# Patient Record
Sex: Female | Born: 1950
Health system: Southern US, Community
[De-identification: ages and names within clinical notes are randomized; demographics above are authoritative.]

## PROBLEM LIST (undated history)

## (undated) DIAGNOSIS — M1990 Unspecified osteoarthritis, unspecified site: Secondary | ICD-10-CM

## (undated) DIAGNOSIS — T8859XA Other complications of anesthesia, initial encounter: Secondary | ICD-10-CM

## (undated) DIAGNOSIS — R112 Nausea with vomiting, unspecified: Secondary | ICD-10-CM

## (undated) DIAGNOSIS — T4145XA Adverse effect of unspecified anesthetic, initial encounter: Secondary | ICD-10-CM

## (undated) DIAGNOSIS — Z9889 Other specified postprocedural states: Secondary | ICD-10-CM

## (undated) DIAGNOSIS — K5900 Constipation, unspecified: Secondary | ICD-10-CM

---
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
## 1976-05-04 HISTORY — PX: SINUS IRRIGATION: SHX2411

## 1987-05-05 DIAGNOSIS — M797 Fibromyalgia: Secondary | ICD-10-CM

## 1987-05-05 HISTORY — DX: Fibromyalgia: M79.7

## 2003-05-06 ENCOUNTER — Emergency Department (HOSPITAL_COMMUNITY): Admission: EM | Admit: 2003-05-06 | Discharge: 2003-05-06 | Payer: Self-pay | Admitting: Emergency Medicine

## 2003-05-08 ENCOUNTER — Encounter: Admission: RE | Admit: 2003-05-08 | Discharge: 2003-05-08 | Payer: Self-pay

## 2004-11-12 ENCOUNTER — Encounter: Admission: RE | Admit: 2004-11-12 | Discharge: 2004-11-12 | Payer: Self-pay | Admitting: Rheumatology

## 2009-04-10 ENCOUNTER — Ambulatory Visit: Payer: Self-pay | Admitting: Women's Health

## 2009-04-10 ENCOUNTER — Other Ambulatory Visit: Admission: RE | Admit: 2009-04-10 | Discharge: 2009-04-10 | Payer: Self-pay | Admitting: Gynecology

## 2009-04-23 ENCOUNTER — Ambulatory Visit: Payer: Self-pay | Admitting: Women's Health

## 2009-05-23 ENCOUNTER — Ambulatory Visit: Payer: Self-pay | Admitting: Women's Health

## 2009-06-12 ENCOUNTER — Ambulatory Visit: Payer: Self-pay | Admitting: Women's Health

## 2009-06-18 ENCOUNTER — Ambulatory Visit: Payer: Self-pay | Admitting: Gynecology

## 2009-07-10 ENCOUNTER — Ambulatory Visit: Payer: Self-pay | Admitting: Gynecology

## 2009-10-23 ENCOUNTER — Ambulatory Visit: Payer: Self-pay | Admitting: Women's Health

## 2010-09-03 ENCOUNTER — Other Ambulatory Visit (HOSPITAL_COMMUNITY)
Admission: RE | Admit: 2010-09-03 | Discharge: 2010-09-03 | Disposition: A | Discharge status: Home / Self Care | Payer: BC Managed Care – PPO | Source: Ambulatory Visit | Attending: Gynecology | Admitting: Gynecology

## 2010-09-03 ENCOUNTER — Encounter (INDEPENDENT_AMBULATORY_CARE_PROVIDER_SITE_OTHER): Payer: BC Managed Care – PPO | Admitting: Women's Health

## 2010-09-03 ENCOUNTER — Other Ambulatory Visit: Payer: Self-pay | Admitting: Women's Health

## 2010-09-03 DIAGNOSIS — Z833 Family history of diabetes mellitus: Secondary | ICD-10-CM

## 2010-09-03 DIAGNOSIS — Z124 Encounter for screening for malignant neoplasm of cervix: Secondary | ICD-10-CM | POA: Insufficient documentation

## 2010-09-03 DIAGNOSIS — R5381 Other malaise: Secondary | ICD-10-CM

## 2010-09-03 DIAGNOSIS — Z01419 Encounter for gynecological examination (general) (routine) without abnormal findings: Secondary | ICD-10-CM

## 2010-09-03 DIAGNOSIS — R823 Hemoglobinuria: Secondary | ICD-10-CM

## 2011-02-25 ENCOUNTER — Encounter (HOSPITAL_COMMUNITY)
Admission: RE | Admit: 2011-02-25 | Discharge: 2011-02-25 | Disposition: A | Discharge status: Home / Self Care | Payer: BC Managed Care – PPO | Source: Ambulatory Visit | Attending: Neurological Surgery | Admitting: Neurological Surgery

## 2011-02-25 LAB — CBC
MCH: 32.5 pg (ref 26.0–34.0)
MCHC: 35 g/dL (ref 30.0–36.0)
MCV: 92.7 fL (ref 78.0–100.0)
Platelets: 174 10*3/uL (ref 150–400)
RBC: 4.22 MIL/uL (ref 3.87–5.11)
RDW: 12.3 % (ref 11.5–15.5)

## 2011-02-25 LAB — ABO/RH: ABO/RH(D): A POS

## 2011-03-03 ENCOUNTER — Inpatient Hospital Stay (HOSPITAL_COMMUNITY): Payer: BC Managed Care – PPO

## 2011-03-03 ENCOUNTER — Inpatient Hospital Stay (HOSPITAL_COMMUNITY)
Admission: RE | Admit: 2011-03-03 | Discharge: 2011-03-07 | DRG: 755 | Disposition: A | Discharge status: Home Health | Payer: BC Managed Care – PPO | Source: Ambulatory Visit | Attending: Neurological Surgery | Admitting: Neurological Surgery

## 2011-03-03 DIAGNOSIS — K589 Irritable bowel syndrome without diarrhea: Secondary | ICD-10-CM | POA: Diagnosis present

## 2011-03-03 DIAGNOSIS — Q762 Congenital spondylolisthesis: Principal | ICD-10-CM

## 2011-03-03 DIAGNOSIS — K5900 Constipation, unspecified: Secondary | ICD-10-CM | POA: Diagnosis not present

## 2011-03-03 DIAGNOSIS — Z01812 Encounter for preprocedural laboratory examination: Secondary | ICD-10-CM

## 2011-03-03 DIAGNOSIS — D62 Acute posthemorrhagic anemia: Secondary | ICD-10-CM | POA: Diagnosis not present

## 2011-03-03 DIAGNOSIS — M48062 Spinal stenosis, lumbar region with neurogenic claudication: Secondary | ICD-10-CM | POA: Diagnosis present

## 2011-03-03 DIAGNOSIS — Z79899 Other long term (current) drug therapy: Secondary | ICD-10-CM

## 2011-03-03 DIAGNOSIS — Z23 Encounter for immunization: Secondary | ICD-10-CM

## 2011-03-04 LAB — BASIC METABOLIC PANEL
Chloride: 104 mEq/L (ref 96–112)
GFR calc non Af Amer: >90 mL/min (ref >90)
Glucose, Bld: 113 mg/dL — ABNORMAL HIGH (ref 70–99)
Potassium: 3.6 mEq/L (ref 3.5–5.1)
Sodium: 138 mEq/L (ref 135–145)

## 2011-03-04 LAB — CBC
HCT: 21 % — ABNORMAL LOW (ref 36.0–46.0)
Hemoglobin: 7.1 g/dL — ABNORMAL LOW (ref 12.0–15.0)
MCHC: 33.8 g/dL (ref 30.0–36.0)
RBC: 2.21 MIL/uL — ABNORMAL LOW (ref 3.87–5.11)
WBC: 9.7 10*3/uL (ref 4.0–10.5)

## 2011-03-04 LAB — GLUCOSE, CAPILLARY: Glucose-Capillary: 92 mg/dL (ref 70–99)

## 2011-03-05 LAB — TYPE AND SCREEN
ABO/RH(D): A POS
Unit division: 0

## 2011-03-05 LAB — CBC
Hemoglobin: 10.2 g/dL — ABNORMAL LOW (ref 12.0–15.0)
MCV: 90.2 fL (ref 78.0–100.0)
Platelets: 114 10*3/uL — ABNORMAL LOW (ref 150–400)
RBC: 3.27 MIL/uL — ABNORMAL LOW (ref 3.87–5.11)
WBC: 9.3 10*3/uL (ref 4.0–10.5)

## 2011-03-06 MED ORDER — SENNA 8.6 MG PO TABS
1.0000 | ORAL_TABLET | Freq: Every evening | ORAL | Status: DC | PRN
  Filled 2011-03-06: qty 1

## 2011-03-06 MED ORDER — SODIUM CHLORIDE 0.9 % IJ SOLN: 3.0000 mL | Freq: Two times a day (BID) | INTRAMUSCULAR | Status: DC

## 2011-03-06 MED ORDER — AZITHROMYCIN 250 MG PO TABS: 250.0000 mg | ORAL_TABLET | ORAL | Status: DC

## 2011-03-06 MED ORDER — DIAZEPAM 5 MG PO TABS: 5.0000 mg | ORAL_TABLET | Freq: Four times a day (QID) | ORAL | Status: DC | PRN

## 2011-03-06 MED ORDER — OXYCODONE-ACETAMINOPHEN 5-325 MG PO TABS: 1.0000 | ORAL_TABLET | ORAL | Status: DC | PRN

## 2011-03-06 MED ORDER — ONDANSETRON HCL 4 MG/2ML IJ SOLN: 4.0000 mg | INTRAMUSCULAR | Status: DC | PRN

## 2011-03-06 MED ORDER — ALUM & MAG HYDROXIDE-SIMETH 200-200-20 MG/5ML PO SUSP: 30.0000 mL | Freq: Four times a day (QID) | ORAL | Status: DC | PRN

## 2011-03-06 MED ORDER — CYCLOSPORINE 0.05 % OP EMUL
1.0000 [drp] | Freq: Two times a day (BID) | OPHTHALMIC | Status: DC
  Filled 2011-03-06 (×4): qty 1

## 2011-03-06 MED ORDER — DOCUSATE SODIUM 100 MG PO CAPS: 100.0000 mg | ORAL_CAPSULE | Freq: Two times a day (BID) | ORAL | Status: DC

## 2011-03-06 MED ORDER — PHENOL 1.4 % MT LIQD: 1.0000 | OROMUCOSAL | Status: DC | PRN

## 2011-03-06 MED ORDER — MENTHOL 3 MG MT LOZG: 1.0000 | LOZENGE | OROMUCOSAL | Status: DC | PRN

## 2011-03-06 NOTE — Op Note (Signed)
NAME:  Melanie Golden, Melanie Golden                  ACCOUNT NO.:  [id number]  MEDICAL RECORD NO.:  [id number]  LOCATION:  3039                         FACILITY:  [facility]  PHYSICIAN:  Stefani Dama, M.D.  DATE OF BIRTH:  1951-03-24  DATE OF PROCEDURE:  03/03/2011 DATE OF DISCHARGE:                              OPERATIVE REPORT   PREOPERATIVE DIAGNOSES:  Spondylolisthesis, L4-L5 and L5-S1 with severe central lumbar stenosis, L4-5; right lumbar stenosis, L5-S1; neurogenic claudication; lumbar radiculopathy.  POSTOPERATIVE DIAGNOSES:  Spondylolisthesis, L4-L5 and L5-S1 with severe central lumbar stenosis, L4-5; right lumbar stenosis, L5-S1; neurogenic claudication; lumbar radiculopathy.  OPERATIONS:  Bilateral laminotomies and decompression of L4, L5, and S1 nerve roots bilaterally, with laminotomies at L4-5; total laminectomy at L5; posterior lumbar interbody arthrodesis with PEEK spacers, local autograft, and allograft with L4-5 and L5-S1 using 13 mm spacers at L4-5 and 11 mm spacers at L5-S1; segmental fixation L4 to sacrum; posterolateral arthrodesis with allograft.  SURGEON:  Stefani Dama, MD  FIRST ASSISTANT:  Clydene Fake, MD  ANESTHESIA:  General endotracheal.  INDICATIONS:  Melanie Golden is a 60 year old individual who has had significant bilateral lower extremity pain.  Has had numbness, tingling, and weakness in the lower extremities.  She has been unable to walk and unable to even do activities of daily living.  Because of her misery, she was advised regarding need for surgery to decompress her severe stenosis at L4-5 and stabilize from L4 to the sacrum.  PROCEDURE:  The patient was brought to the operating room, supine on the stretcher.  After smooth induction of general endotracheal anesthesia, she was placed in a prone position.  The back was prepped with alcohol and DuraPrep, and draped in a sterile fashion.  Midline incision was created and carried down to the lumbodorsal  fascia, which was opened on either side of midline, and then we identified the spinous processes of L5 and L4 positively on radiographs.  By performing large laminotomies of the inferior margin of L4, the common dural tube was exposed. Significant overgrowth in the facet joints was encountered and this was causing and contributing to a substantial central canal stenosis.  Once this area was decompressed, the path of the L4 nerve root superiorly and the L5 nerve root inferiorly could be traced.  There was a significant spondylitic ridge from the spondylolisthesis at the L5 junction.  The disk was noted to be soft in this area and was incised, first on the left side then on the right side, and a gross total diskectomy was performed at L4-L5. After assuring that the paths of the L4 and L5 nerve roots were well protected.  A diskectomy was performed from both sides. The endplates were curetted and rongeured smooth and then trial spacers were placed, first at  11 mm, then 12 and then ultimately at 13 mm.  The 13 mm peek spacers were then packed with Vitoss, which was soaked with PureGen and autograft was placed into the interspace after an adequate decortication was performed; 13 mm spacers were then placed, first on the left side and then on the right side.  Some additional strips of Vitoss bone sponge with  the PureGen on it was placed into the interspace also.  Attention was then turned to L5-S1 where similar procedure was carried out after total laminectomy of L5, which allowed for good visualization of the right L5 nerve root, particularly which was severely stenotic.  The path of the L5 nerve root was cleared into the extraforaminal space.  Diskectomy was performed in a similar fashion, as it was at L4-L5, and a gross total decompression and total diskectomy was performed here.  Ultimately, starting an 8 mm spacer, we were able to dilate to 11 mm size and 11 mm PEEK spacers were filled  with similar material and the graft space was filled with autograft and some remaining strips of Vitoss bone sponge soaked with PureGen.  In the end, the paths for the S1, the L5, and the L4 nerve roots were checked and found to be free and clear.  Pedicle entry sites were then chosen, using fluoroscopic guidance to place 5.5 x 30 mm screws using a medial to lateral trajectory in L4, a 5.5 x 40 mm screws in L5 and 6.5 x 30 mm screws in the sacrum, again using a medial to lateral trajectory.  The 60 mm precontoured rods were then contoured further to fit in the saddles of the screws and these were torqued down to the final resting position.  In the end, good decompression of the individual nerve roots and the common dural tube was achieved.  Lateral gutters were packed with additional Vitoss bone sponge without PureGen after the lateral gutters had been decorticated.  With this then, hemostasis in the soft tissues was obtained, and the lumbodorsal fascia was closed with #1 Vicryl interrupted fashion, 2-0 Vicryl in the subcutaneous tissues.  At this point, we also injected 10 mL of 0.5% Marcaine into the intramuscular and subfascial space, and then the final skin closure was performed with 3-0 Vicryl in inverted interrupted fashion.  Dermabond was placed on the skin.  Blood loss was estimated at 750 mL and 250 mL of Cell Saver blood was returned.     Stefani Dama, M.D.     Merla Riches  D:  03/03/2011  T:  03/03/2011  Job:  [id number]  Electronically Signed by Barnett Abu M.D. on 03/06/2011 05:50:15 AM

## 2011-03-08 NOTE — Discharge Summary (Signed)
  [name], [name]                  ACCOUNT NO.:  [id number]  MEDICAL RECORD NO.:  [id number]  LOCATION:  3039                         FACILITY:  [facility]  PHYSICIAN:  Stefani Dama, M.D.  DATE OF BIRTH:  Dec 23, 1950  DATE OF ADMISSION:  03/03/2011 DATE OF DISCHARGE:  03/07/2011                              DISCHARGE SUMMARY   ADMITTING DIAGNOSES: 1. Spondylolisthesis, L4-5 and L5-S1 with severe central lumbar     stenosis, L4-5. 2. Right lumbar stenosis, L5-S1. 3. Neurogenic claudication, lumbar radiculopathy.  DISCHARGED AND FINAL DIAGNOSES: 1. Spondylolisthesis, L4-L5 and L5-S1. 2. Severe central lumbar stenosis, L4-5. 3. Right lumbar stenosis, L5-S1. 4. Neurogenic claudication, lumbar radiculopathy. 5. Acute blood loss anemia status post transfusion 2 units packed     cells. 6. Constipation postoperatively.  CONDITION ON DISCHARGE:  Improving.  HOSPITAL COURSE:  Ms. Melanie Golden is a 60 year old individual who has had significant problems with back pain, lower extremity pain and weakness. She has evidence of significant spondylosis in the lower lumbar spine with spondylolisthesis and stenosis and was taken to the operating room on March 03, 2011, to undergo decompression of L4-5 and L5-S1. Posterior lumbar interbody arthrodesis was created with a segmental fixation L4 to the sacrum.  Postoperatively, the patient was noted to feel rather weak.  She had a hemoglobin down to 7 and had acute blood loss anemia.  This was treated with 2 units packed cells transfusion and her situation improved.  She has gradually mobilized.  Her incision has remained clean and dry, and at that the time of discharge, she is ambulating with the use of a walker.  She did have postoperative constipation, was relieved with laxatives.  She will be seen in the office in 3 weeks' time for follow up.  She is given a prescription for Percocet #40 without refill,  Valium 5 mg, #40 without refill as  needed for pain and spasm.     Stefani Dama, M.D.     Merla Riches  D:  03/06/2011  T:  03/07/2011  Job:  [id number]  Electronically Signed by Barnett Abu M.D. on 03/08/2011 10:21:29 PM

## 2011-05-05 HISTORY — PX: BACK SURGERY: SHX140

## 2013-08-29 ENCOUNTER — Other Ambulatory Visit: Payer: Self-pay | Admitting: Dermatology

## 2017-07-20 ENCOUNTER — Other Ambulatory Visit: Payer: Self-pay | Admitting: Family Medicine

## 2017-07-20 DIAGNOSIS — Z1231 Encounter for screening mammogram for malignant neoplasm of breast: Secondary | ICD-10-CM

## 2017-08-06 ENCOUNTER — Ambulatory Visit
Admission: RE | Admit: 2017-08-06 | Discharge: 2017-08-06 | Disposition: A | Discharge status: Home / Self Care | Payer: BLUE CROSS/BLUE SHIELD | Source: Ambulatory Visit | Attending: Family Medicine | Admitting: Family Medicine

## 2017-08-06 DIAGNOSIS — Z1231 Encounter for screening mammogram for malignant neoplasm of breast: Secondary | ICD-10-CM

## 2019-06-30 DIAGNOSIS — M47812 Spondylosis without myelopathy or radiculopathy, cervical region: Secondary | ICD-10-CM | POA: Diagnosis not present

## 2019-06-30 DIAGNOSIS — M19012 Primary osteoarthritis, left shoulder: Secondary | ICD-10-CM | POA: Diagnosis not present

## 2019-07-06 DIAGNOSIS — M25512 Pain in left shoulder: Secondary | ICD-10-CM | POA: Diagnosis not present

## 2019-07-11 DIAGNOSIS — M19012 Primary osteoarthritis, left shoulder: Secondary | ICD-10-CM | POA: Diagnosis not present

## 2019-07-19 DIAGNOSIS — M19012 Primary osteoarthritis, left shoulder: Secondary | ICD-10-CM | POA: Diagnosis not present

## 2019-07-27 DIAGNOSIS — Z79899 Other long term (current) drug therapy: Secondary | ICD-10-CM | POA: Diagnosis not present

## 2019-07-27 DIAGNOSIS — Z5181 Encounter for therapeutic drug level monitoring: Secondary | ICD-10-CM | POA: Diagnosis not present

## 2019-07-27 DIAGNOSIS — Z0000 Encounter for general adult medical examination without abnormal findings: Secondary | ICD-10-CM | POA: Diagnosis not present

## 2019-07-27 DIAGNOSIS — Z13220 Encounter for screening for lipoid disorders: Secondary | ICD-10-CM | POA: Diagnosis not present

## 2019-08-07 DIAGNOSIS — S40012A Contusion of left shoulder, initial encounter: Secondary | ICD-10-CM | POA: Diagnosis not present

## 2019-08-11 NOTE — Patient Instructions (Signed)
DUE TO COVID-19 ONLY ONE VISITOR IS ALLOWED TO COME WITH YOU AND STAY IN THE WAITING ROOM ONLY DURING PRE OP AND PROCEDURE DAY OF SURGERY. THE 2 VISITORS MAY VISIT WITH YOU AFTER SURGERY IN YOUR PRIVATE ROOM DURING VISITING HOURS ONLY!  YOU NEED TO HAVE A COVID 19 TEST ON_4/16______ @_1 :00______, THIS TEST MUST BE DONE BEFORE SURGERY, COME  [place] , [id number].  ([place]) ONCE YOUR COVID TEST IS COMPLETED, PLEASE BEGIN THE QUARANTINE INSTRUCTIONS AS OUTLINED IN YOUR HANDOUT.                Melanie Golden    Your procedure is scheduled on: 08/22/19   Report to [facility] Main  Entrance   Report to Short Stay at 5:30 AM     Call this number if you have problems the morning of surgery [phone]   . BRUSH YOUR TEETH MORNING OF SURGERY AND RINSE YOUR MOUTH OUT, NO CHEWING GUM CANDY OR MINTS.   Do not eat food After Midnight.   YOU MAY HAVE CLEAR LIQUIDS FROM MIDNIGHT UNTIL 4:30 AM.   At 4:30 AM Please finish the prescribed Pre-Surgery  drink.   Nothing by mouth after you finish the  drink !    Take these medicines the morning of surgery with A SIP OF WATER:  You may use your eye drops                                You may not have any metal on your body including hair pins and              piercings  Do not wear jewelry, make-up, lotions, powders or perfumes, deodorant             Do not wear nail polish on your fingernails.  Do not shave  48 hours prior to surgery.               Do not bring valuables to the hospital. [place].  Contacts, dentures or bridgework may not be worn into surgery.                    Please read over the following fact sheets you were given: _____________________________________________________________________  [facility]- Preparing for Total Shoulder Arthroplasty    Before surgery, you can play an important role. Because skin is not sterile, your  skin needs to be as free of germs as possible. You can reduce the number of germs on your skin by using the following products. . Benzoyl Peroxide Gel o Reduces the number of germs present on the skin o Applied twice a day to shoulder area starting two days before surgery    ==================================================================  Please follow these instructions carefully:  BENZOYL PEROXIDE 5% GEL  Please do not use if you have an allergy to benzoyl peroxide.   If your skin becomes reddened/irritated stop using the benzoyl peroxide.  Starting two days before surgery, apply as follows: 1. Apply benzoyl peroxide in the morning and at night. Apply after taking a shower. If you are not taking a shower clean entire shoulder front, back, and side along with the armpit with a clean wet washcloth.  2. Place a quarter-sized dollop on your shoulder and rub in thoroughly,  making sure to cover the front, back, and side of your shoulder, along with the armpit.   2 days before ____ AM   ____ PM              1 day before ____ AM   ____ PM                         3. Do this twice a day for two days.  (Last application is the night before surgery, AFTER using the CHG soap as described below).  4. Do NOT apply benzoyl peroxide gel on the day of surgery.            [facility] - Preparing for Surgery Before surgery, you can play an important role.   Because skin is not sterile, your skin needs to be as free of germs as possible.   You can reduce the number of germs on your skin by washing with CHG (chlorahexidine gluconate) soap before surgery .  CHG is an antiseptic cleaner which kills germs and bonds with the skin to continue killing germs even after washing. Please DO NOT use if you have an allergy to CHG or antibacterial soaps.   If your skin becomes reddened/irritated stop using the CHG and inform your nurse when you arrive at Short Stay. Do not shave (including legs and underarms) for  at least 48 hours prior to the first CHG shower.   Please follow these instructions carefully:  1.  Shower with CHG Soap the night before surgery and the  morning of Surgery.  2.  If you choose to wash your hair, wash your hair first as usual with your  normal  shampoo.  3.  After you shampoo, rinse your hair and body thoroughly to remove the  shampoo.                                        4.  Use CHG as you would any other liquid soap.  You can apply chg directly  to the skin and wash                       Gently with a scrungie or clean washcloth.  5.  Apply the CHG Soap to your body ONLY FROM THE NECK DOWN.   Do not use on face/ open                           Wound or open sores. Avoid contact with eyes, ears mouth and genitals (private parts).                       Wash face,  Genitals (private parts) with your normal soap.             6.  Wash thoroughly, paying special attention to the area where your surgery  will be performed.  7.  Thoroughly rinse your body with warm water from the neck down.  8.  DO NOT shower/wash with your normal soap after using and rinsing off  the CHG Soap.             9.  Pat yourself dry with a clean towel.            10.  Wear clean pajamas.  11.  Place clean sheets on your bed the night of your first shower and do not  sleep with pets. Day of Surgery : Do not apply any lotions/deodorants the morning of surgery.  Please wear clean clothes to the hospital/surgery center.  FAILURE TO FOLLOW THESE INSTRUCTIONS MAY RESULT IN THE CANCELLATION OF YOUR SURGERY PATIENT SIGNATURE_________________________________  NURSE SIGNATURE__________________________________  ________________________________________________________________________   [name]  An incentive spirometer is a tool that can help keep your lungs clear and active. This tool measures how well you are filling your lungs with each breath. Taking long deep breaths may help reverse  or decrease the chance of developing breathing (pulmonary) problems (especially infection) following:  A long period of time when you are unable to move or be active. BEFORE THE PROCEDURE   If the spirometer includes an indicator to show your best effort, your nurse or respiratory therapist will set it to a desired goal.  If possible, sit up straight or lean slightly forward. Try not to slouch.  Hold the incentive spirometer in an upright position. INSTRUCTIONS FOR USE  1. Sit on the edge of your bed if possible, or sit up as far as you can in bed or on a chair. 2. Hold the incentive spirometer in an upright position. 3. Breathe out normally. 4. Place the mouthpiece in your mouth and seal your lips tightly around it. 5. Breathe in slowly and as deeply as possible, raising the piston or the ball toward the top of the column. 6. Hold your breath for 3-5 seconds or for as long as possible. Allow the piston or ball to fall to the bottom of the column. 7. Remove the mouthpiece from your mouth and breathe out normally. 8. Rest for a few seconds and repeat Steps 1 through 7 at least 10 times every 1-2 hours when you are awake. Take your time and take a few normal breaths between deep breaths. 9. The spirometer may include an indicator to show your best effort. Use the indicator as a goal to work toward during each repetition. 10. After each set of 10 deep breaths, practice coughing to be sure your lungs are clear. If you have an incision (the cut made at the time of surgery), support your incision when coughing by placing a pillow or rolled up towels firmly against it. Once you are able to get out of bed, walk around indoors and cough well. You may stop using the incentive spirometer when instructed by your caregiver.  RISKS AND COMPLICATIONS  Take your time so you do not get dizzy or light-headed.  If you are in pain, you may need to take or ask for pain medication before doing incentive  spirometry. It is harder to take a deep breath if you are having pain. AFTER USE  Rest and breathe slowly and easily.  It can be helpful to keep track of a log of your progress. Your caregiver can provide you with a simple table to help with this. If you are using the spirometer at home, follow these instructions: SEEK MEDICAL CARE IF:   You are having difficultly using the spirometer.  You have trouble using the spirometer as often as instructed.  Your pain medication is not giving enough relief while using the spirometer.  You develop fever of 100.5 F (38.1 C) or higher. SEEK IMMEDIATE MEDICAL CARE IF:   You cough up bloody sputum that had not been present before.  You develop fever of 102 F (38.9 C)  or greater.  You develop worsening pain at or near the incision site. MAKE SURE YOU:   Understand these instructions.  Will watch your condition.  Will get help right away if you are not doing well or get worse. Document Released: 08/31/2006 Document Revised: 07/13/2011 Document Reviewed: 11/01/2006 [facility] Patient Information 2014 [place], [place].   ________________________________________________________________________

## 2019-08-14 ENCOUNTER — Encounter (HOSPITAL_COMMUNITY)
Admission: RE | Admit: 2019-08-14 | Discharge: 2019-08-14 | Disposition: A | Discharge status: Home / Self Care | Payer: Medicare HMO | Source: Ambulatory Visit | Attending: Orthopedic Surgery | Admitting: Orthopedic Surgery

## 2019-08-14 ENCOUNTER — Encounter (HOSPITAL_COMMUNITY): Payer: Self-pay | Admitting: *Deleted

## 2019-08-14 ENCOUNTER — Other Ambulatory Visit: Payer: Self-pay

## 2019-08-14 DIAGNOSIS — Z01812 Encounter for preprocedural laboratory examination: Secondary | ICD-10-CM | POA: Diagnosis not present

## 2019-08-14 HISTORY — DX: Other specified postprocedural states: Z98.890

## 2019-08-14 HISTORY — DX: Nausea with vomiting, unspecified: R11.2

## 2019-08-14 HISTORY — DX: Other complications of anesthesia, initial encounter: T88.59XA

## 2019-08-14 HISTORY — DX: Unspecified osteoarthritis, unspecified site: M19.90

## 2019-08-14 LAB — CBC
HCT: 40.3 % (ref 36.0–46.0)
Hemoglobin: 13.2 g/dL (ref 12.0–15.0)
MCH: 31.6 pg (ref 26.0–34.0)
MCHC: 32.8 g/dL (ref 30.0–36.0)
MCV: 96.4 fL (ref 80.0–100.0)
Platelets: 190 10*3/uL (ref 150–400)
RBC: 4.18 MIL/uL (ref 3.87–5.11)
RDW: 12.5 % (ref 11.5–15.5)
WBC: 4.6 10*3/uL (ref 4.0–10.5)
nRBC: 0 % (ref 0.0–0.2)

## 2019-08-14 LAB — SURGICAL PCR SCREEN
MRSA, PCR: NEGATIVE
Staphylococcus aureus: POSITIVE — AB

## 2019-08-14 NOTE — Progress Notes (Signed)
PCR results 08/14/19 faxed to Dr. Dion Saucier via epic.

## 2019-08-14 NOTE — Progress Notes (Signed)
PCP - Dr. Andee Poles Cardiologist - no  Chest x-ray - no EKG -no  Stress Test - no ECHO - no Cardiac Cath - no  Sleep Study - no CPAP -   Fasting Blood Sugar - NA Checks Blood Sugar _____ times a day  Blood Thinner Instructions:NA Aspirin Instructions: Last Dose:  Anesthesia review:   Patient denies shortness of breath, fever, cough and chest pain at PAT appointment yes  Patient verbalized understanding of instructions that were given to them at the PAT appointment. Patient was also instructed that they will need to review over the PAT instructions again at home before surgery. yes

## 2019-08-18 ENCOUNTER — Other Ambulatory Visit (HOSPITAL_COMMUNITY)
Admission: RE | Admit: 2019-08-18 | Discharge: 2019-08-18 | Disposition: A | Discharge status: Home / Self Care | Payer: Medicare HMO | Source: Ambulatory Visit | Attending: Orthopedic Surgery | Admitting: Orthopedic Surgery

## 2019-08-18 DIAGNOSIS — Z20822 Contact with and (suspected) exposure to covid-19: Secondary | ICD-10-CM | POA: Diagnosis not present

## 2019-08-18 DIAGNOSIS — Z01812 Encounter for preprocedural laboratory examination: Secondary | ICD-10-CM | POA: Diagnosis not present

## 2019-08-18 LAB — SARS CORONAVIRUS 2 (TAT 6-24 HRS): SARS Coronavirus 2: NEGATIVE

## 2019-08-21 ENCOUNTER — Encounter (HOSPITAL_COMMUNITY): Payer: Self-pay | Admitting: Orthopedic Surgery

## 2019-08-21 NOTE — Anesthesia Preprocedure Evaluation (Addendum)
Anesthesia Evaluation  Patient identified by MRN, date of birth, ID band Patient awake    Reviewed: Allergy & Precautions, NPO status , Patient's Chart, lab work & pertinent test results  History of Anesthesia Complications (+) PONV and history of anesthetic complications  Airway Mallampati: II  TM Distance: >3 FB Neck ROM: Full    Dental no notable dental hx. (+) Teeth Intact   Pulmonary neg pulmonary ROS,    Pulmonary exam normal breath sounds clear to auscultation       Cardiovascular negative cardio ROS Normal cardiovascular exam Rhythm:Regular Rate:Normal     Neuro/Psych  Neuromuscular disease negative psych ROS   GI/Hepatic negative GI ROS, Neg liver ROS,   Endo/Other  negative endocrine ROS  Renal/GU negative Renal ROS  negative genitourinary   Musculoskeletal  (+) Arthritis , Osteoarthritis,  Fibromyalgia -DJD left shoulder   Abdominal   Peds  Hematology negative hematology ROS (+)   Anesthesia Other Findings   Reproductive/Obstetrics                            Anesthesia Physical Anesthesia Plan  ASA: II  Anesthesia Plan: General   Post-op Pain Management:  Regional for Post-op pain   Induction: Intravenous  PONV Risk Score and Plan: 4 or greater and Scopolamine patch - Pre-op, Ondansetron, Dexamethasone, Treatment may vary due to age or medical condition and Diphenhydramine  Airway Management Planned: Oral ETT  Additional Equipment:   Intra-op Plan:   Post-operative Plan: Extubation in OR  Informed Consent: I have reviewed the patients History and Physical, chart, labs and discussed the procedure including the risks, benefits and alternatives for the proposed anesthesia with the patient or authorized representative who has indicated his/her understanding and acceptance.     Dental advisory given  Plan Discussed with: CRNA and Surgeon  Anesthesia Plan Comments:         Anesthesia Quick Evaluation

## 2019-08-22 ENCOUNTER — Other Ambulatory Visit: Payer: Self-pay

## 2019-08-22 ENCOUNTER — Encounter (HOSPITAL_COMMUNITY)
Admission: RE | Disposition: A | Discharge status: Home / Self Care | Payer: Self-pay | Source: Home / Self Care | Attending: Orthopedic Surgery

## 2019-08-22 ENCOUNTER — Ambulatory Visit (HOSPITAL_COMMUNITY): Payer: Medicare HMO | Admitting: Anesthesiology

## 2019-08-22 ENCOUNTER — Observation Stay (HOSPITAL_COMMUNITY): Payer: Medicare HMO

## 2019-08-22 ENCOUNTER — Encounter (HOSPITAL_COMMUNITY): Payer: Self-pay | Admitting: Orthopedic Surgery

## 2019-08-22 ENCOUNTER — Observation Stay (HOSPITAL_COMMUNITY)
Admission: RE | Admit: 2019-08-22 | Discharge: 2019-08-23 | Disposition: A | Discharge status: Home / Self Care | Payer: Medicare HMO | Attending: Orthopedic Surgery | Admitting: Orthopedic Surgery

## 2019-08-22 DIAGNOSIS — Z79899 Other long term (current) drug therapy: Secondary | ICD-10-CM | POA: Insufficient documentation

## 2019-08-22 DIAGNOSIS — M19012 Primary osteoarthritis, left shoulder: Principal | ICD-10-CM | POA: Diagnosis present

## 2019-08-22 DIAGNOSIS — Z471 Aftercare following joint replacement surgery: Secondary | ICD-10-CM | POA: Diagnosis not present

## 2019-08-22 DIAGNOSIS — G8918 Other acute postprocedural pain: Secondary | ICD-10-CM | POA: Diagnosis not present

## 2019-08-22 DIAGNOSIS — Z96612 Presence of left artificial shoulder joint: Secondary | ICD-10-CM | POA: Diagnosis not present

## 2019-08-22 HISTORY — PX: TOTAL SHOULDER ARTHROPLASTY: SHX126

## 2019-08-22 SURGERY — ARTHROPLASTY, SHOULDER, TOTAL
Anesthesia: General | Site: Shoulder | Laterality: Left

## 2019-08-22 MED ORDER — METOCLOPRAMIDE HCL 5 MG/ML IJ SOLN: 10.0000 mg | Freq: Once | INTRAMUSCULAR | Status: DC | PRN

## 2019-08-22 MED ORDER — FENTANYL CITRATE (PF) 100 MCG/2ML IJ SOLN
INTRAMUSCULAR | Status: AC
  Filled 2019-08-22: qty 2

## 2019-08-22 MED ORDER — POLYVINYL ALCOHOL 1.4 % OP SOLN
1.0000 [drp] | Freq: Three times a day (TID) | OPHTHALMIC | Status: DC | PRN
  Filled 2019-08-22: qty 15

## 2019-08-22 MED ORDER — FENTANYL CITRATE (PF) 100 MCG/2ML IJ SOLN
INTRAMUSCULAR | Status: DC | PRN
  Administered 2019-08-22: 50 ug via INTRAVENOUS
  Administered 2019-08-22 (×2): 25 ug via INTRAVENOUS

## 2019-08-22 MED ORDER — ROCURONIUM BROMIDE 10 MG/ML (PF) SYRINGE
PREFILLED_SYRINGE | INTRAVENOUS | Status: AC
  Filled 2019-08-22: qty 10

## 2019-08-22 MED ORDER — ZOLPIDEM TARTRATE 5 MG PO TABS: 5.0000 mg | ORAL_TABLET | Freq: Every evening | ORAL | Status: DC | PRN

## 2019-08-22 MED ORDER — SENNA-DOCUSATE SODIUM 8.6-50 MG PO TABS: 2.0000 | ORAL_TABLET | Freq: Every day | ORAL | 1 refills | Status: DC

## 2019-08-22 MED ORDER — ROCURONIUM BROMIDE 10 MG/ML (PF) SYRINGE
PREFILLED_SYRINGE | INTRAVENOUS | Status: DC | PRN
  Administered 2019-08-22: 40 mg via INTRAVENOUS

## 2019-08-22 MED ORDER — SUGAMMADEX SODIUM 200 MG/2ML IV SOLN
INTRAVENOUS | Status: DC | PRN
  Administered 2019-08-22: 120 mg via INTRAVENOUS

## 2019-08-22 MED ORDER — LIDOCAINE HCL (CARDIAC) PF 100 MG/5ML IV SOSY
PREFILLED_SYRINGE | INTRAVENOUS | Status: DC | PRN
  Administered 2019-08-22: 60 mg via INTRAVENOUS

## 2019-08-22 MED ORDER — METOCLOPRAMIDE HCL 5 MG PO TABS: 5.0000 mg | ORAL_TABLET | Freq: Three times a day (TID) | ORAL | Status: DC | PRN

## 2019-08-22 MED ORDER — BUPIVACAINE LIPOSOME 1.3 % IJ SUSP
INTRAMUSCULAR | Status: DC | PRN
  Administered 2019-08-22: 10 mL via PERINEURAL

## 2019-08-22 MED ORDER — POVIDONE-IODINE 10 % EX SWAB
2.0000 "application " | Freq: Once | CUTANEOUS | Status: AC
  Administered 2019-08-22: 2 via TOPICAL

## 2019-08-22 MED ORDER — PHENOL 1.4 % MT LIQD: 1.0000 | OROMUCOSAL | Status: DC | PRN

## 2019-08-22 MED ORDER — MORPHINE SULFATE (PF) 2 MG/ML IV SOLN: 0.5000 mg | INTRAVENOUS | Status: DC | PRN

## 2019-08-22 MED ORDER — HYDROCODONE-ACETAMINOPHEN 5-325 MG PO TABS: 1.0000 | ORAL_TABLET | ORAL | Status: DC | PRN

## 2019-08-22 MED ORDER — DIPHENHYDRAMINE HCL 12.5 MG/5ML PO ELIX: 12.5000 mg | ORAL_SOLUTION | ORAL | Status: DC | PRN

## 2019-08-22 MED ORDER — ONDANSETRON HCL 4 MG PO TABS: 4.0000 mg | ORAL_TABLET | Freq: Three times a day (TID) | ORAL | 0 refills | Status: DC | PRN

## 2019-08-22 MED ORDER — MIDAZOLAM HCL 2 MG/2ML IJ SOLN
INTRAMUSCULAR | Status: AC
  Filled 2019-08-22: qty 2

## 2019-08-22 MED ORDER — OXYCODONE HCL 5 MG PO TABS: 5.0000 mg | ORAL_TABLET | Freq: Once | ORAL | Status: DC | PRN

## 2019-08-22 MED ORDER — OXYCODONE HCL 5 MG/5ML PO SOLN: 5.0000 mg | Freq: Once | ORAL | Status: DC | PRN

## 2019-08-22 MED ORDER — BIOTIN 5000 MCG PO CAPS: 5000.0000 ug | ORAL_CAPSULE | Freq: Every day | ORAL | Status: DC

## 2019-08-22 MED ORDER — ACETAMINOPHEN 325 MG PO TABS: 325.0000 mg | ORAL_TABLET | Freq: Four times a day (QID) | ORAL | Status: DC | PRN

## 2019-08-22 MED ORDER — ONDANSETRON HCL 4 MG/2ML IJ SOLN
INTRAMUSCULAR | Status: DC | PRN
  Administered 2019-08-22: 4 mg via INTRAVENOUS

## 2019-08-22 MED ORDER — VITAMIN D 25 MCG (1000 UNIT) PO TABS
2000.0000 [IU] | ORAL_TABLET | Freq: Every day | ORAL | Status: DC
  Administered 2019-08-23: 2000 [IU] via ORAL
  Filled 2019-08-22: qty 2

## 2019-08-22 MED ORDER — ONDANSETRON HCL 4 MG/2ML IJ SOLN
INTRAMUSCULAR | Status: AC
  Filled 2019-08-22: qty 2

## 2019-08-22 MED ORDER — PHENYLEPHRINE HCL-NACL 10-0.9 MG/250ML-% IV SOLN
INTRAVENOUS | Status: DC | PRN
  Administered 2019-08-22: 25 ug/min via INTRAVENOUS

## 2019-08-22 MED ORDER — MENTHOL 3 MG MT LOZG: 1.0000 | LOZENGE | OROMUCOSAL | Status: DC | PRN

## 2019-08-22 MED ORDER — METHOCARBAMOL 500 MG PO TABS: 500.0000 mg | ORAL_TABLET | Freq: Four times a day (QID) | ORAL | Status: DC | PRN

## 2019-08-22 MED ORDER — PROPOFOL 10 MG/ML IV BOLUS
INTRAVENOUS | Status: DC | PRN
  Administered 2019-08-22: 110 mg via INTRAVENOUS

## 2019-08-22 MED ORDER — PROPOFOL 10 MG/ML IV BOLUS
INTRAVENOUS | Status: AC
  Filled 2019-08-22: qty 20

## 2019-08-22 MED ORDER — LACTATED RINGERS IV SOLN: INTRAVENOUS | Status: DC

## 2019-08-22 MED ORDER — ADULT MULTIVITAMIN W/MINERALS CH
1.0000 | ORAL_TABLET | Freq: Every day | ORAL | Status: DC
  Administered 2019-08-23: 1 via ORAL
  Filled 2019-08-22 (×2): qty 1

## 2019-08-22 MED ORDER — CEFAZOLIN SODIUM-DEXTROSE 2-4 GM/100ML-% IV SOLN
2.0000 g | INTRAVENOUS | Status: AC
  Administered 2019-08-22: 2 g via INTRAVENOUS

## 2019-08-22 MED ORDER — DEXAMETHASONE SODIUM PHOSPHATE 10 MG/ML IJ SOLN
INTRAMUSCULAR | Status: AC
  Filled 2019-08-22: qty 1

## 2019-08-22 MED ORDER — BUPIVACAINE-EPINEPHRINE 0.5% -1:200000 IJ SOLN
INTRAMUSCULAR | Status: AC
  Filled 2019-08-22: qty 1

## 2019-08-22 MED ORDER — ALUM & MAG HYDROXIDE-SIMETH 200-200-20 MG/5ML PO SUSP: 30.0000 mL | ORAL | Status: DC | PRN

## 2019-08-22 MED ORDER — HYDROCODONE-ACETAMINOPHEN 10-325 MG PO TABS: 1.0000 | ORAL_TABLET | Freq: Four times a day (QID) | ORAL | 0 refills | Status: DC | PRN

## 2019-08-22 MED ORDER — LIDOCAINE 2% (20 MG/ML) 5 ML SYRINGE
INTRAMUSCULAR | Status: AC
  Filled 2019-08-22: qty 5

## 2019-08-22 MED ORDER — POLYETHYLENE GLYCOL 3350 17 G PO PACK: 17.0000 g | PACK | Freq: Every day | ORAL | Status: DC | PRN

## 2019-08-22 MED ORDER — ONDANSETRON HCL 4 MG/2ML IJ SOLN: 4.0000 mg | Freq: Four times a day (QID) | INTRAMUSCULAR | Status: DC | PRN

## 2019-08-22 MED ORDER — KETOROLAC TROMETHAMINE 15 MG/ML IJ SOLN
7.5000 mg | Freq: Four times a day (QID) | INTRAMUSCULAR | Status: AC
  Administered 2019-08-22 – 2019-08-23 (×4): 7.5 mg via INTRAVENOUS
  Filled 2019-08-22 (×4): qty 1

## 2019-08-22 MED ORDER — METOCLOPRAMIDE HCL 5 MG/ML IJ SOLN: 5.0000 mg | Freq: Three times a day (TID) | INTRAMUSCULAR | Status: DC | PRN

## 2019-08-22 MED ORDER — EPHEDRINE 5 MG/ML INJ
INTRAVENOUS | Status: AC
  Filled 2019-08-22: qty 10

## 2019-08-22 MED ORDER — MIDAZOLAM HCL 2 MG/2ML IJ SOLN
INTRAMUSCULAR | Status: DC | PRN
  Administered 2019-08-22 (×2): 1 mg via INTRAVENOUS

## 2019-08-22 MED ORDER — METHOCARBAMOL 500 MG IVPB - SIMPLE MED
500.0000 mg | Freq: Four times a day (QID) | INTRAVENOUS | Status: DC | PRN
  Filled 2019-08-22: qty 50

## 2019-08-22 MED ORDER — DIPHENHYDRAMINE HCL 50 MG/ML IJ SOLN
INTRAMUSCULAR | Status: DC | PRN
  Administered 2019-08-22: 12.5 mg via INTRAVENOUS

## 2019-08-22 MED ORDER — POTASSIUM CHLORIDE IN NACL 20-0.45 MEQ/L-% IV SOLN
INTRAVENOUS | Status: DC
  Filled 2019-08-22 (×2): qty 1000

## 2019-08-22 MED ORDER — FENTANYL CITRATE (PF) 100 MCG/2ML IJ SOLN: 25.0000 ug | INTRAMUSCULAR | Status: DC | PRN

## 2019-08-22 MED ORDER — SODIUM CHLORIDE 0.9 % IR SOLN
Status: DC | PRN
  Administered 2019-08-22: 1000 mL

## 2019-08-22 MED ORDER — RISAQUAD PO CAPS
1.0000 | ORAL_CAPSULE | Freq: Every day | ORAL | Status: DC
  Administered 2019-08-23: 09:00:00 1 via ORAL
  Filled 2019-08-22: qty 1

## 2019-08-22 MED ORDER — KETOTIFEN FUMARATE 0.025 % OP SOLN
1.0000 [drp] | Freq: Every day | OPHTHALMIC | Status: DC | PRN
  Filled 2019-08-22: qty 5

## 2019-08-22 MED ORDER — PHENYLEPHRINE HCL (PRESSORS) 10 MG/ML IV SOLN
INTRAVENOUS | Status: AC
  Filled 2019-08-22: qty 1

## 2019-08-22 MED ORDER — CEFAZOLIN SODIUM-DEXTROSE 2-4 GM/100ML-% IV SOLN
INTRAVENOUS | Status: AC
  Filled 2019-08-22: qty 100

## 2019-08-22 MED ORDER — EPHEDRINE SULFATE-NACL 50-0.9 MG/10ML-% IV SOSY
PREFILLED_SYRINGE | INTRAVENOUS | Status: DC | PRN
  Administered 2019-08-22 (×6): 5 mg via INTRAVENOUS

## 2019-08-22 MED ORDER — ACETAMINOPHEN 500 MG PO TABS
ORAL_TABLET | ORAL | Status: AC
  Administered 2019-08-22: 1000 mg via ORAL
  Filled 2019-08-22: qty 2

## 2019-08-22 MED ORDER — ACETAMINOPHEN 500 MG PO TABS
500.0000 mg | ORAL_TABLET | Freq: Four times a day (QID) | ORAL | Status: AC
  Administered 2019-08-22 – 2019-08-23 (×3): 500 mg via ORAL
  Filled 2019-08-22 (×3): qty 1

## 2019-08-22 MED ORDER — VITAMIN B-12 1000 MCG PO TABS
1000.0000 ug | ORAL_TABLET | Freq: Every day | ORAL | Status: DC
  Administered 2019-08-23: 09:00:00 1000 ug via ORAL
  Filled 2019-08-22: qty 1

## 2019-08-22 MED ORDER — ACETAMINOPHEN 500 MG PO TABS: 1000.0000 mg | ORAL_TABLET | Freq: Once | ORAL | Status: AC

## 2019-08-22 MED ORDER — DEXAMETHASONE SODIUM PHOSPHATE 10 MG/ML IJ SOLN
INTRAMUSCULAR | Status: DC | PRN
  Administered 2019-08-22: 5 mg via INTRAVENOUS

## 2019-08-22 MED ORDER — DOCUSATE SODIUM 100 MG PO CAPS
100.0000 mg | ORAL_CAPSULE | Freq: Two times a day (BID) | ORAL | Status: DC
  Administered 2019-08-22 – 2019-08-23 (×2): 100 mg via ORAL
  Filled 2019-08-22 (×2): qty 1

## 2019-08-22 MED ORDER — MAGNESIUM CITRATE PO SOLN: 1.0000 | Freq: Once | ORAL | Status: DC | PRN

## 2019-08-22 MED ORDER — MAGNESIUM OXIDE 400 (241.3 MG) MG PO TABS
400.0000 mg | ORAL_TABLET | Freq: Every day | ORAL | Status: DC
  Administered 2019-08-23: 400 mg via ORAL
  Filled 2019-08-22: qty 1

## 2019-08-22 MED ORDER — HYDROCODONE-ACETAMINOPHEN 7.5-325 MG PO TABS: 1.0000 | ORAL_TABLET | ORAL | Status: DC | PRN

## 2019-08-22 MED ORDER — DIPHENHYDRAMINE HCL 50 MG/ML IJ SOLN
INTRAMUSCULAR | Status: AC
  Filled 2019-08-22: qty 1

## 2019-08-22 MED ORDER — CEFAZOLIN SODIUM-DEXTROSE 1-4 GM/50ML-% IV SOLN
1.0000 g | Freq: Four times a day (QID) | INTRAVENOUS | Status: AC
  Administered 2019-08-22 – 2019-08-23 (×3): 1 g via INTRAVENOUS
  Filled 2019-08-22 (×3): qty 50

## 2019-08-22 MED ORDER — ONDANSETRON HCL 4 MG PO TABS: 4.0000 mg | ORAL_TABLET | Freq: Four times a day (QID) | ORAL | Status: DC | PRN

## 2019-08-22 MED ORDER — LIDOCAINE HCL (PF) 1 % IJ SOLN
INTRAMUSCULAR | Status: AC
  Filled 2019-08-22: qty 30

## 2019-08-22 MED ORDER — BISACODYL 10 MG RE SUPP: 10.0000 mg | Freq: Every day | RECTAL | Status: DC | PRN

## 2019-08-22 MED ORDER — SALICYLIC ACID 3 % EX CREA: 1.0000 "application " | TOPICAL_CREAM | Freq: Every day | CUTANEOUS | Status: DC | PRN

## 2019-08-22 MED ORDER — BACLOFEN 10 MG PO TABS: 10.0000 mg | ORAL_TABLET | Freq: Three times a day (TID) | ORAL | 0 refills | Status: AC

## 2019-08-22 MED ORDER — STERILE WATER FOR IRRIGATION IR SOLN
Status: DC | PRN
  Administered 2019-08-22: 1000 mL

## 2019-08-22 MED ORDER — BUPIVACAINE-EPINEPHRINE (PF) 0.5% -1:200000 IJ SOLN
INTRAMUSCULAR | Status: DC | PRN
  Administered 2019-08-22: 20 mL

## 2019-08-22 MED ORDER — PHENYLEPHRINE 40 MCG/ML (10ML) SYRINGE FOR IV PUSH (FOR BLOOD PRESSURE SUPPORT)
PREFILLED_SYRINGE | INTRAVENOUS | Status: DC | PRN
  Administered 2019-08-22: 120 ug via INTRAVENOUS
  Administered 2019-08-22 (×2): 40 ug via INTRAVENOUS

## 2019-08-22 SURGICAL SUPPLY — 60 items
BAG ZIPLOCK 12X15 (MISCELLANEOUS) ×2 IMPLANT
BLADE SAW SGTL 18X1.27X75 (BLADE) ×2 IMPLANT
CEMENT BONE R 1X40 (Cement) ×2 IMPLANT
CLSR STERI-STRIP ANTIMIC 1/2X4 (GAUZE/BANDAGES/DRESSINGS) ×2 IMPLANT
COVER BACK TABLE 60X90IN (DRAPES) ×2 IMPLANT
COVER MAYO STAND STRL (DRAPES) ×2 IMPLANT
COVER SURGICAL LIGHT HANDLE (MISCELLANEOUS) ×2 IMPLANT
COVER WAND RF STERILE (DRAPES) IMPLANT
DECANTER SPIKE VIAL GLASS SM (MISCELLANEOUS) IMPLANT
DRAPE POUCH INSTRU U-SHP 10X18 (DRAPES) ×2 IMPLANT
DRAPE SHEET LG 3/4 BI-LAMINATE (DRAPES) ×2 IMPLANT
DRAPE SURG 17X11 SM STRL (DRAPES) ×2 IMPLANT
DRAPE U-SHAPE 47X51 STRL (DRAPES) ×2 IMPLANT
DRSG MEPILEX BORDER 4X8 (GAUZE/BANDAGES/DRESSINGS) ×2 IMPLANT
DURAPREP 26ML APPLICATOR (WOUND CARE) ×2 IMPLANT
ELECT REM PT RETURN 15FT ADLT (MISCELLANEOUS) ×2 IMPLANT
GLENOID PEGGED HYBRID SM 4MM (Orthopedic Implant) ×2 IMPLANT
GLENOID POST REGENREX HYBRID (Orthopedic Implant) ×2 IMPLANT
GLOVE BIO SURGEON STRL SZ7.5 (GLOVE) ×2 IMPLANT
GLOVE BIO SURGEON STRL SZ8 (GLOVE) ×2 IMPLANT
GLOVE BIOGEL PI IND STRL 8 (GLOVE) ×2 IMPLANT
GLOVE BIOGEL PI INDICATOR 8 (GLOVE) ×2
GOWN STRL REUS W/TWL 2XL LVL3 (GOWN DISPOSABLE) ×2 IMPLANT
GOWN STRL REUS W/TWL LRG LVL3 (GOWN DISPOSABLE) ×2 IMPLANT
HANDPIECE INTERPULSE COAX TIP (DISPOSABLE) ×2
HEAD HUMERAL BIPOLAR 38X19X39 (Miscellaneous) ×1 IMPLANT
HEAD HUMERAL COMP STD (Orthopedic Implant) ×1 IMPLANT
HOOD PEEL AWAY FLYTE STAYCOOL (MISCELLANEOUS) ×6 IMPLANT
HUMERAL HEAD BIPOLAR 38X19X39 (Miscellaneous) ×2 IMPLANT
HUMERAL HEAD COMP STD (Orthopedic Implant) ×2 IMPLANT
KIT BASIN OR (CUSTOM PROCEDURE TRAY) ×2 IMPLANT
KIT TURNOVER KIT A (KITS) IMPLANT
NS IRRIG 1000ML POUR BTL (IV SOLUTION) ×2 IMPLANT
PACK SHOULDER (CUSTOM PROCEDURE TRAY) ×2 IMPLANT
PAD COLD SHLDR WRAP-ON (PAD) ×2 IMPLANT
PENCIL SMOKE EVACUATOR (MISCELLANEOUS) IMPLANT
PIN HUMERAL STMN 3.2MMX9IN (INSTRUMENTS) ×2 IMPLANT
PIN STEINMANN THREADED TIP (PIN) ×2 IMPLANT
PROTECTOR NERVE ULNAR (MISCELLANEOUS) ×2 IMPLANT
RESTRAINT HEAD UNIVERSAL NS (MISCELLANEOUS) IMPLANT
SET HNDPC FAN SPRY TIP SCT (DISPOSABLE) ×1 IMPLANT
SLING ARM IMMOBILIZER LRG (SOFTGOODS) ×2 IMPLANT
SLING ARM IMMOBILIZER MED (SOFTGOODS) ×2 IMPLANT
SMARTMIX MINI TOWER (MISCELLANEOUS)
STEM HUMERAL STRL 12MMX14MM (Stem) ×2 IMPLANT
SUCTION FRAZIER HANDLE 12FR (TUBING) ×2
SUCTION TUBE FRAZIER 12FR DISP (TUBING) ×1 IMPLANT
SUPPORT WRAP ARM LG (MISCELLANEOUS) ×2 IMPLANT
SUT FIBERWIRE #2 38 REV NDL BL (SUTURE) ×10
SUT VIC AB 1 CT1 36 (SUTURE) ×2 IMPLANT
SUT VIC AB 2-0 CT1 27 (SUTURE) ×2
SUT VIC AB 2-0 CT1 TAPERPNT 27 (SUTURE) ×1 IMPLANT
SUT VIC AB 3-0 SH 8-18 (SUTURE) ×2 IMPLANT
SUTURE FIBERWR#2 38 REV NDL BL (SUTURE) ×5 IMPLANT
TAPE STRIPS DRAPE STRL (GAUZE/BANDAGES/DRESSINGS) ×2 IMPLANT
TOWEL OR 17X26 10 PK STRL BLUE (TOWEL DISPOSABLE) ×2 IMPLANT
TOWEL OR NON WOVEN STRL DISP B (DISPOSABLE) ×2 IMPLANT
TOWER SMARTMIX MINI (MISCELLANEOUS) IMPLANT
WATER STERILE IRR 1000ML POUR (IV SOLUTION) ×4 IMPLANT
YANKAUER SUCT BULB TIP 10FT TU (MISCELLANEOUS) ×2 IMPLANT

## 2019-08-22 NOTE — H&P (Signed)
PREOPERATIVE H&P  Chief Complaint: Left shoulder primary localized osteoarthritis  HPI: Melanie Golden is a 69 y.o. female who presents for preoperative history and physical with a diagnosis of left shoulder primary localized osteoarthritis. Symptoms are rated as moderate to severe, and have been worsening.  This is significantly impairing activities of daily living.  She has elected for surgical management.   She has failed injections, activity modification, anti-inflammatories, and assistive devices.  Preoperative X-rays demonstrate end stage degenerative changes with osteophyte formation, loss of joint space, subchondral sclerosis.   Past Medical History:  Diagnosis Date  . Arthritis    knee,shoulder,cervical,hand  . Complication of anesthesia   . Fibromyalgia 1989  . PONV (postoperative nausea and vomiting)    Past Surgical History:  Procedure Laterality Date  . BACK SURGERY  2013   with fusion  . SINUS IRRIGATION  1978   Social History   Socioeconomic History  . Marital status: Married    Spouse name: Not on file  . Number of children: Not on file  . Years of education: Not on file  . Highest education level: Not on file  Occupational History  . Not on file  Tobacco Use  . Smoking status: Never Smoker  . Smokeless tobacco: Never Used  Substance and Sexual Activity  . Alcohol use: Never  . Drug use: Never  . Sexual activity: Not on file  Other Topics Concern  . Not on file  Social History Narrative  . Not on file   Social Determinants of Health   Financial Resource Strain:   . Difficulty of Paying Living Expenses:   Food Insecurity:   . Worried About Charity fundraiser in the Last Year:   . Arboriculturist in the Last Year:   Transportation Needs:   . Film/video editor (Medical):   Marland Kitchen Lack of Transportation (Non-Medical):   Physical Activity:   . Days of Exercise per Week:   . Minutes of Exercise per Session:   Stress:   . Feeling of Stress :    Social Connections:   . Frequency of Communication with Friends and Family:   . Frequency of Social Gatherings with Friends and Family:   . Attends Religious Services:   . Active Member of Clubs or Organizations:   . Attends Archivist Meetings:   Marland Kitchen Marital Status:    History reviewed. No pertinent family history. Allergies  Allergen Reactions  . Sudafed [Pseudoephedrine Hcl]     Increased BP  . Ampicillin Rash  . Sulfa Antibiotics Rash   Prior to Admission medications   Medication Sig Start Date End Date Taking? Authorizing Provider  Biotin 5000 MCG CAPS Take 5,000 mcg by mouth daily.   Yes [provider]  carboxymethylcellulose (REFRESH PLUS) 0.5 % SOLN Place 1 drop into both eyes 3 (three) times daily as needed (Dry eyes).   Yes [provider]  Cholecalciferol (VITAMIN D) 2000 UNITS tablet Take 2,000 Units by mouth daily.     Yes [provider]  docusate sodium (COLACE) 100 MG capsule Take 100-200 mg by mouth in the morning and at bedtime. Takes two in the morning and one in the evening.   Yes [provider]  ketotifen (ZADITOR) 0.025 % ophthalmic solution Place 1 drop into both eyes daily as needed (Allergies).   Yes [provider]  Magnesium 500 MG CAPS Take 500 mg by mouth daily.   Yes [provider]  Multiple Vitamins-Minerals (HAIR  SKIN AND NAILS FORMULA PO) Take 1 tablet by mouth daily.   Yes [provider]  OVER THE COUNTER MEDICATION Take 1 capsule by mouth daily. Thera tears eye nutrition   Yes [provider]  OVER THE COUNTER MEDICATION Take 1 capsule by mouth in the morning and at bedtime. Viviscal for thinning hair.   Yes [provider]  Probiotic Product (PROBIOTIC FORMULA PO) Take 1 tablet by mouth daily.     Yes [provider]  Salicylic Acid (GOLD BOND PSORIASIS RELIEF) 3 % CREA Apply 1 application topically daily as needed (Dry/irritated skin).   Yes  [provider]  vitamin B-12 (CYANOCOBALAMIN) 1000 MCG tablet Take 1,000 mcg by mouth daily.   Yes [provider]     Positive ROS: All other systems have been reviewed and were otherwise negative with the exception of those mentioned in the HPI and as above.  Physical Exam: General: Alert, no acute distress Cardiovascular: No pedal edema Respiratory: No cyanosis, no use of accessory musculature GI: No organomegaly, abdomen is soft and non-tender Skin: No lesions in the area of chief complaint Neurologic: Sensation intact distally Psychiatric: Patient is competent for consent with normal mood and affect Lymphatic: No axillary or cervical lymphadenopathy  MUSCULOSKELETAL: Left shoulder active motion 0 to 85 degrees with crepitance and a painful arc.  Assessment: Left shoulder primary localized osteoarthritis   Plan: Plan for Procedure(s): TOTAL SHOULDER ARTHROPLASTY  The risks benefits and alternatives were discussed with the patient including but not limited to the risks of nonoperative treatment, versus surgical intervention including infection, bleeding, nerve injury,  blood clots, cardiopulmonary complications, morbidity, mortality, among others, and they were willing to proceed.    Patient's anticipated LOS is less than 2 midnights, meeting these requirements: - Younger than 46 - Lives within 1 hour of care - Has a competent adult at home to recover with post-op recover - NO history of  - Chronic pain requiring opiods  - Diabetes  - Coronary Artery Disease  - Heart failure  - Heart attack  - Stroke  - DVT/VTE  - Cardiac arrhythmia  - Respiratory Failure/COPD  - Renal failure  - Anemia  - Advanced Liver disease        Eulas Post, MD Cell [phone]   08/22/2019 7:23 AM

## 2019-08-22 NOTE — Anesthesia Postprocedure Evaluation (Signed)
Anesthesia Post Note  Patient: Melanie Golden  Procedure(s) Performed: TOTAL SHOULDER ARTHROPLASTY (Left Shoulder)     Patient location during evaluation: PACU Anesthesia Type: General Level of consciousness: awake and alert and oriented Pain management: pain level controlled Vital Signs Assessment: post-procedure vital signs reviewed and stable Respiratory status: spontaneous breathing, nonlabored ventilation and respiratory function stable Cardiovascular status: blood pressure returned to baseline and stable Postop Assessment: no apparent nausea or vomiting Anesthetic complications: no    Last Vitals:  Vitals:   08/22/19 1027 08/22/19 1040  BP: 114/89 116/73  Pulse: 78 79  Resp: 12 14  Temp: (!) 36.4 C   SpO2: 98% 98%    Last Pain:  Vitals:   08/22/19 1040  TempSrc:   PainSc: 0-No pain                 Nafeesah Lapaglia A.

## 2019-08-22 NOTE — Anesthesia Procedure Notes (Signed)
Anesthesia Regional Block: Interscalene brachial plexus block   Pre-Anesthetic Checklist: ,, timeout performed, Correct Patient, Correct Site, Correct Laterality, Correct Procedure, Correct Position, site marked, Risks and benefits discussed,  Surgical consent,  Pre-op evaluation,  At surgeon's request and post-op pain management  Laterality: Left  Prep: chloraprep       Needles:  Injection technique: Single-shot  Needle Type: Echogenic Stimulator Needle     Needle Length: 9cm  Needle Gauge: 21   Needle insertion depth: 4 cm   Additional Needles:   Procedures:,,,, ultrasound used (permanent image in chart),,,,  Narrative:  Start time: 08/22/2019 7:09 AM End time: 08/22/2019 7:15 AM Injection made incrementally with aspirations every 5 mL.  Performed by: Personally  Anesthesiologist: Mal Amabile, MD  Additional Notes: Timeout performed. Patient sedated. Relevant anatomy ID'd using [place]. Incremental 2-66ml injection of LA with frequent aspiration. Patient tolerated procedure well.

## 2019-08-22 NOTE — Transfer of Care (Signed)
Immediate Anesthesia Transfer of Care Note  Patient: Melanie Golden  Procedure(s) Performed: TOTAL SHOULDER ARTHROPLASTY (Left Shoulder)  Patient Location: PACU  Anesthesia Type:GA combined with regional for post-op pain  Level of Consciousness: drowsy and patient cooperative  Airway & Oxygen Therapy: Patient Spontanous Breathing and Patient connected to nasal cannula oxygen  Post-op Assessment: Report given to RN and Post -op Vital signs reviewed and stable  Post vital signs: Reviewed and stable  Last Vitals:  Vitals Value Taken Time  BP 110/70 08/22/19 1000  Temp    Pulse 83 08/22/19 1000  Resp 21 08/22/19 1000  SpO2 95 % 08/22/19 1000  Vitals shown include unvalidated device data.  Last Pain:  Vitals:   08/22/19 0621  TempSrc: Oral  PainSc:       Patients Stated Pain Goal: 2 (08/22/19 0602)  Complications: No apparent anesthesia complications

## 2019-08-22 NOTE — Anesthesia Procedure Notes (Signed)
Procedure Name: Intubation Date/Time: 08/22/2019 7:35 AM Performed by: Raenette Rover, CRNA Pre-anesthesia Checklist: Patient identified, Emergency Drugs available, Suction available and Patient being monitored Patient Re-evaluated:Patient Re-evaluated prior to induction Oxygen Delivery Method: Circle system utilized Preoxygenation: Pre-oxygenation with 100% oxygen Induction Type: IV induction Ventilation: Mask ventilation without difficulty Laryngoscope Size: Mac and 3 Grade View: Grade I Tube type: Oral Tube size: 7.0 mm Number of attempts: 1 Airway Equipment and Method: Stylet Placement Confirmation: ETT inserted through vocal cords under direct vision,  positive ETCO2 and breath sounds checked- equal and bilateral Secured at: 21 cm Tube secured with: Tape Dental Injury: Teeth and Oropharynx as per pre-operative assessment

## 2019-08-22 NOTE — Op Note (Signed)
08/22/2019  9:38 AM  PATIENT:  Melanie Golden    PRE-OPERATIVE DIAGNOSIS: Left shoulder glenohumeral osteoarthritis, primary localized  POST-OPERATIVE DIAGNOSIS:  Same  PROCEDURE: LEFT total Shoulder Arthroplasty  SURGEON:  Eulas Post, MD  PHYSICIAN ASSISTANT: Janine Ores, PA-C, present and scrubbed throughout the case, critical for completion in a timely fashion, and for retraction, instrumentation, and closure.  ANESTHESIA:   General with an interscalene block  ESTIMATED BLOOD LOSS: 100 mL  UNIQUE ASPECTS OF THE CASE: Her anatomy was very small.  Her tissue planes were fairly mobile, and somewhat hyperelastic.  There was a moderate amount of osteophyte formation, the glenoid was extremely small.  Even having been exactly centered in the glenoid, I was just at the very peripheral edges with the pegs, and the posterior inferior peg was slightly breaching the cortex as well as the face.  There was fairly advanced posterior wear.  The arm holder disengaged midway through the case, and so we controlled the arm manually during the subsequent portions of the case.  The humeral head had excellent mobility at the completion of the case, I debated whether I needed to go up on a head size although the osteophytes were present, and after removal of the osteophytes in the humeral head the smaller head fit best.  PREOPERATIVE INDICATIONS:  Melanie Golden is a  69 y.o. female who failed conservative measures and elected for surgical management.    The risks benefits and alternatives were discussed with the patient preoperatively including but not limited to the risks of infection, bleeding, nerve injury, cardiopulmonary complications, the need for revision surgery, dislocation, loosening, incomplete relief of pain, among others, and the patient was willing to proceed.   OPERATIVE IMPLANTS: Biomet size 12 micro press-fit humeral stem, size 38+19 versa-dial humeral head, set in the D position with  increased coverage posteriorly, with a small cemented glenoid polyethylene 3 peg implant with a central regenerex noncemented post.   OPERATIVE FINDINGS: Advanced glenohumeral osteoarthritis involving the glenoid and the humeral head with substantial osteophyte formation inferiorly.   OPERATIVE PROCEDURE: The patient was brought to the operating room and placed in the supine position. General anesthesia was administered. IV antibiotics were given.  The upper extremity was prepped and draped in usual sterile fashion. The patient was in a beachchair position with all bony prominences padded.   Time out was performed and a deltopectoral approach was carried out. The biceps tendon was tenodesed to the pectoralis tendon. The subscapularis was released, tagging it with a #2 FiberWire, leaving a cuff of tendon for repair.   The inferior osteophyte was removed, and release of the capsule off of the humeral side was completed. The head was dislocated, and I reamed sequentially. I placed the humeral cutting guide at 30 of retroversion, and then pinned this into place, and made my humeral neck cut. This was at the appropriate level.   I then placed deep retractors and exposed the glenoid. I excised the labrum circumferentially, taking care to protect the axillary nerve inferiorly.   I then placed a guidewire into the center position, controlling appropriate version and inclination. I then reamed over the guidewire with the small reamer, and was satisfied with the preparation. I preserved the subchondral bone in order to maximize the strength and minimize the risk for subsequent subsidence.   I then drilled the central hole for the regenerex peg, and then placed the guide, and then drilled the 3 peripheral peg holes. I had excellent  bony circumferential contact.   I then cleaned the glenoid, irrigated it copiously, and then dried it and cemented the prosthesis into place. Excellent seating was achieved. I had  full exposure. The cement cured while I turned my attention to the humeral side.   I sequentially broached, up to the selected size, with the broach set at 30 of retroversion. I placed 3 #2 Fiberwire through the bone for subsequent repair.  I then placed the real stem. I trialed with multiple heads, and the above-named component was selected. Increased posterior coverage improved the coverage. The soft tissue tension was appropriate.   I then impacted the real humeral head into place, reduced the head, and irrigated copiously. Excellent stability and range of motion was achieved. I repaired the subscapularis with a total of 4 #2 FiberWire; one for the interval, one for the corner, and then the remaining three from the lesser tuberosity which had already been passed.  Excellent repair achieved and I irrigated copiously once more. The subcutaneous tissue was closed with Vicryl including the deltopectoral fascia.   The skin was closed with Steri-Strips and sterile gauze was applied. She had a preoperative nerve block. She tolerated the procedure well and there were no complications.

## 2019-08-22 NOTE — Discharge Instructions (Signed)
INSTRUCTIONS AFTER JOINT REPLACEMENT   o Remove items at home which could result in a fall. This includes throw rugs or furniture in walking pathways o ICE to the affected joint every three hours while awake for 30 minutes at a time, for at least the first 3-5 days, and then as needed for pain and swelling.  Continue to use ice for pain and swelling. You may notice swelling that will progress down to the forearm or hand.  This is normal after surgery. o Continue to use the breathing machine you got in the hospital (incentive spirometer) which will help keep your temperature down.  It is common for your temperature to cycle up and down following surgery, especially at night when you are not up moving around and exerting yourself.  The breathing machine keeps your lungs expanded and your temperature down.   DIET:  As you were doing prior to hospitalization, we recommend a well-balanced diet.  DRESSING / WOUND CARE / SHOWERING  You may change your dressing 3-5 days after surgery.  Then change the dressing every day with sterile gauze.  Please use good hand washing techniques before changing the dressing.  Do not use any lotions or creams on the incision until instructed by your surgeon.  ACTIVITY  o Increase activity slowly as tolerated, but follow the weight bearing instructions below.   o No driving for 6 weeks or until further direction given by your physician.  You cannot drive while taking narcotics.  o Avoid periods of inactivity such as sitting longer than an hour when not asleep. This helps prevent blood clots.  o You may return to work once you are authorized by your doctor.     WEIGHT BEARING   Other:  Non weight bearing - left arm   CONSTIPATION  Constipation is defined medically as fewer than three stools per week and severe constipation as less than one stool per week.  Even if you have a regular bowel pattern at home, your normal regimen is likely to be disrupted due to  multiple reasons following surgery.  Combination of anesthesia, postoperative narcotics, change in appetite and fluid intake all can affect your bowels.   YOU MUST use at least one of the following options; they are listed in order of increasing strength to get the job done.  They are all available over the counter, and you may need to use some, POSSIBLY even all of these options:    Drink plenty of fluids (prune juice may be helpful) and high fiber foods Colace 100 mg by mouth twice a day  Senokot for constipation as directed and as needed Dulcolax (bisacodyl), take with full glass of water  Miralax (polyethylene glycol) once or twice a day as needed.  If you have tried all these things and are unable to have a bowel movement in the first 3-4 days after surgery call either your surgeon or your primary doctor.    If you experience loose stools or diarrhea, hold the medications until you stool forms back up.  If your symptoms do not get better within 1 week or if they get worse, check with your doctor.  If you experience "the worst abdominal pain ever" or develop nausea or vomiting, please contact the office immediately for further recommendations for treatment.   ITCHING:  If you experience itching with your medications, try taking only a single pain pill, or even half a pain pill at a time.  You can also use Benadryl over  the counter for itching or also to help with sleep.   MEDICATIONS:  See your medication summary on the "After Visit Summary" that nursing will review with you.  You may have some home medications which will be placed on hold until you complete the course of blood thinner medication.  It is important for you to complete the blood thinner medication as prescribed.  PRECAUTIONS:  If you experience chest pain or shortness of breath - call 911 immediately for transfer to the hospital emergency department.   If you develop a fever greater that 101 F, purulent drainage from wound,  increased redness or drainage from wound, foul odor from the wound/dressing, or calf pain - CONTACT YOUR SURGEON.                                                   FOLLOW-UP APPOINTMENTS:  If you do not already have a post-op appointment, please call the office for an appointment to be seen by your surgeon.  Guidelines for how soon to be seen are listed in your "After Visit Summary", but are typically between 1-4 weeks after surgery.  DENTAL ANTIBIOTICS:  In most cases prophylactic antibiotics for Dental procdeures after total joint surgery are not necessary.  Exceptions are as follows:  1. History of prior total joint infection  2. Severely immunocompromised (Organ Transplant, cancer chemotherapy, Rheumatoid biologic meds such as Humera)  3. Poorly controlled diabetes (A1C &gt; 8.0, blood glucose over 200)  If you have one of these conditions, contact your surgeon for an antibiotic prescription, prior to your dental procedure.   MAKE SURE YOU:  . Understand these instructions.  . Get help right away if you are not doing well or get worse.    Thank you for letting us be a part of your medical care team.  It is a privilege we respect greatly.  We hope these instructions will help you stay on track for a fast and full recovery!

## 2019-08-23 ENCOUNTER — Observation Stay (HOSPITAL_COMMUNITY): Payer: Medicare HMO

## 2019-08-23 ENCOUNTER — Encounter: Payer: Self-pay | Admitting: *Deleted

## 2019-08-23 DIAGNOSIS — Z96612 Presence of left artificial shoulder joint: Secondary | ICD-10-CM | POA: Diagnosis not present

## 2019-08-23 DIAGNOSIS — M19012 Primary osteoarthritis, left shoulder: Secondary | ICD-10-CM | POA: Diagnosis not present

## 2019-08-23 DIAGNOSIS — Z471 Aftercare following joint replacement surgery: Secondary | ICD-10-CM | POA: Diagnosis not present

## 2019-08-23 DIAGNOSIS — Z79899 Other long term (current) drug therapy: Secondary | ICD-10-CM | POA: Diagnosis not present

## 2019-08-23 LAB — BASIC METABOLIC PANEL
Anion gap: 5 (ref 5–15)
BUN: 12 mg/dL (ref 8–23)
CO2: 29 mmol/L (ref 22–32)
Calcium: 8.3 mg/dL — ABNORMAL LOW (ref 8.9–10.3)
Chloride: 106 mmol/L (ref 98–111)
Creatinine, Ser: 0.75 mg/dL (ref 0.44–1.00)
GFR calc Af Amer: >60 mL/min (ref >60)
GFR calc non Af Amer: >60 mL/min (ref >60)
Glucose, Bld: 112 mg/dL — ABNORMAL HIGH (ref 70–99)
Potassium: 4.1 mmol/L (ref 3.5–5.1)
Sodium: 140 mmol/L (ref 135–145)

## 2019-08-23 LAB — CBC
HCT: 31.3 % — ABNORMAL LOW (ref 36.0–46.0)
Hemoglobin: 10.1 g/dL — ABNORMAL LOW (ref 12.0–15.0)
MCH: 31.7 pg (ref 26.0–34.0)
MCHC: 32.3 g/dL (ref 30.0–36.0)
MCV: 98.1 fL (ref 80.0–100.0)
Platelets: 129 10*3/uL — ABNORMAL LOW (ref 150–400)
RBC: 3.19 MIL/uL — ABNORMAL LOW (ref 3.87–5.11)
RDW: 12.8 % (ref 11.5–15.5)
WBC: 6.6 10*3/uL (ref 4.0–10.5)
nRBC: 0 % (ref 0.0–0.2)

## 2019-08-23 NOTE — Discharge Summary (Signed)
Discharge Summary  Patient ID: Melanie Golden MRN: [id number] DOB/AGE: 12/23/1950 69 y.o.  Admit date: 08/22/2019 Discharge date: 08/23/2019  Admission Diagnoses:  Osteoarthritis of left shoulder  Discharge Diagnoses:  Principal Problem:   Osteoarthritis of left shoulder   Past Medical History:  Diagnosis Date  . Arthritis    knee,shoulder,cervical,hand  . Complication of anesthesia   . Fibromyalgia 1989  . PONV (postoperative nausea and vomiting)     Surgeries: Procedure(s): TOTAL SHOULDER ARTHROPLASTY on 08/22/2019   Consultants (if any):   Discharged Condition: Improved  Hospital Course: Melanie Golden is an 69 y.o. female who was admitted 08/22/2019 with a diagnosis of Osteoarthritis of left shoulder and went to the operating room on 08/22/2019 and underwent the above named procedures.    She was given perioperative antibiotics:  Anti-infectives (From admission, onward)   Start     Dose/Rate Route Frequency Ordered Stop   08/22/19 1400  ceFAZolin (ANCEF) IVPB 1 g/50 mL premix     1 g 100 mL/hr over 30 Minutes Intravenous Every 6 hours 08/22/19 1040 08/23/19 0330   08/22/19 0600  ceFAZolin (ANCEF) IVPB 2g/100 mL premix     2 g 200 mL/hr over 30 Minutes Intravenous On call to O.R. 08/22/19 0545 08/22/19 0716   08/22/19 0549  ceFAZolin (ANCEF) 2-4 GM/100ML-% IVPB    Note to Pharmacy: Melanie Golden   : cabinet override      08/22/19 0549 08/22/19 0717    .  She was given sequential compression devices and early ambulation for DVT prophylaxis.  She benefited maximally from the hospital stay and there were no complications.    Recent vital signs:  Vitals:   08/23/19 0138 08/23/19 0642  BP: 98/64 103/68  Pulse: 62 (!) 54  Resp: 16 16  Temp: 97.9 F (36.6 C) 98 F (36.7 C)  SpO2: 100% 100%    Recent laboratory studies:  Lab Results  Component Value Date   HGB 10.1 (L) 08/23/2019   HGB 13.2 08/14/2019   HGB 10.2 (L) 03/05/2011   Lab Results  Component  Value Date   WBC 6.6 08/23/2019   PLT 129 (L) 08/23/2019   No results found for: INR Lab Results  Component Value Date   NA 140 08/23/2019   K 4.1 08/23/2019   CL 106 08/23/2019   CO2 29 08/23/2019   BUN 12 08/23/2019   CREATININE 0.75 08/23/2019   GLUCOSE 112 (H) 08/23/2019    Discharge Medications:   Allergies as of 08/23/2019      Reactions   Sudafed [pseudoephedrine Hcl]    Increased BP   Ampicillin Rash   Sulfa Antibiotics Rash      Medication List    TAKE these medications   baclofen 10 MG tablet Commonly known as: LIORESAL Take 1 tablet (10 mg total) by mouth 3 (three) times daily. As needed for muscle spasm   Biotin 5000 MCG Caps Take 5,000 mcg by mouth daily.   carboxymethylcellulose 0.5 % Soln Commonly known as: REFRESH PLUS Place 1 drop into both eyes 3 (three) times daily as needed (Dry eyes).   docusate sodium 100 MG capsule Commonly known as: COLACE Take 100-200 mg by mouth in the morning and at bedtime. Takes two in the morning and one in the evening.   Gold Bond Psoriasis Relief 3 % Crea Generic drug: Salicylic Acid Apply 1 application topically daily as needed (Dry/irritated skin).   HAIR SKIN AND NAILS FORMULA PO Take 1 tablet by mouth  daily.   HYDROcodone-acetaminophen 10-325 MG tablet Commonly known as: Norco Take 1 tablet by mouth every 6 (six) hours as needed.   ketotifen 0.025 % ophthalmic solution Commonly known as: ZADITOR Place 1 drop into both eyes daily as needed (Allergies).   Magnesium 500 MG Caps Take 500 mg by mouth daily.   ondansetron 4 MG tablet Commonly known as: Zofran Take 1 tablet (4 mg total) by mouth every 8 (eight) hours as needed for nausea or vomiting.   OVER THE COUNTER MEDICATION Take 1 capsule by mouth daily. Thera tears eye nutrition   OVER THE COUNTER MEDICATION Take 1 capsule by mouth in the morning and at bedtime. Viviscal for thinning hair.   PROBIOTIC FORMULA PO Take 1 tablet by mouth daily.    sennosides-docusate sodium 8.6-50 MG tablet Commonly known as: SENOKOT-S Take 2 tablets by mouth daily.   vitamin B-12 1000 MCG tablet Commonly known as: CYANOCOBALAMIN Take 1,000 mcg by mouth daily.   Vitamin D 50 MCG (2000 UT) tablet Take 2,000 Units by mouth daily.       Diagnostic Studies: DG Shoulder Left Port  Result Date: 08/22/2019 CLINICAL DATA:  Status post left shoulder arthroplasty. EXAM: LEFT SHOULDER COMPARISON:  June 30, 2019. FINDINGS: The left glenoid and humeral components appear to be well situated. No fracture or dislocation is noted. Ribs appear normal. IMPRESSION: Status post left shoulder arthroplasty. Electronically Signed   By: Lupita Raider M.D.   On: 08/22/2019 12:57    Disposition: Discharge disposition: 01-Home or Self Care         Follow-up Information    Teryl Lucy, MD. Schedule an appointment as soon as possible for a visit in 2 weeks.   Specialty: Orthopedic Surgery Contact information: [address] [place] [address] [phone]            Signed: Annita Brod 08/23/2019, 8:15 AM

## 2019-08-23 NOTE — Progress Notes (Signed)
Occupational Therapy Evaluation Patient Details Name: Melanie Golden MRN: [id number] DOB: 02-May-1951 Today's Date: 08/23/2019    History of Present Illness 69 yo s/p L TSA. PMH: osteoarthritis; fibromyalgia; back surgery   Clinical Impression   Completed all education regarding compensatory strategies for ADL s/p TSA. Son present for education and verbalized understanding. Written handout provided and reviewed. Pt able to return demonstrate techniques with min vc. Pt was concerned regarding fit of sling - sling properly adjusted and fits appropriately. Nsg notified that pt does not need another sling. Pt to follow up with Dr Dion Saucier for further therapy needs.     Follow Up Recommendations  Follow surgeon's recommendation for DC plan and follow-up therapies;Supervision - Intermittent    Equipment Recommendations  None recommended by OT    Recommendations for Other Services       Precautions / Restrictions Precautions Precautions: Shoulder Type of Shoulder Precautions: elbow wrist and hand ROM only Shoulder Interventions: Shoulder sling/immobilizer Precaution Booklet Issued: Yes (comment) Required Braces or Orthoses: Sling Restrictions Weight Bearing Restrictions: Yes LUE Weight Bearing: Non weight bearing      Mobility Bed Mobility Overal bed mobility: Modified Independent             General bed mobility comments: plans to sleep in recliner  Transfers Overall transfer level: Needs assistance               General transfer comment: S due to being mildly unsteady fro being in bed. No LOB    Balance                                           ADL either performed or assessed with clinical judgement   ADL Overall ADL's : Needs assistance/impaired                                     Functional mobility during ADLs: Supervision/safety General ADL Comments: Educated on compensatory strategies for ADL. Son present for education .  [employer] handout provided and reviewed. Pt verbalized understanding and able to return demosntrae with min VC. Educated to wear sling at all times with exception of ADL and exercise. Sling fits well after properly adjusted.   Educated on proper positioning with pillow behind shoulder to prevent shoulder extension and need to support forearm when sitting to reduce stress/pull on neck. Educated on use of ice for edema and pain control.      Vision Baseline Vision/History: Wears glasses       Perception     Praxis      Pertinent Vitals/Pain Pain Assessment: Faces Pain Score: (without movement) Faces Pain Scale: Hurts a little bit Pain Location: L shoulder Pain Descriptors / Indicators: Discomfort Pain Intervention(s): Limited activity within patient's tolerance     Hand Dominance Right   Extremity/Trunk Assessment Upper Extremity Assessment Upper Extremity Assessment: RUE deficits/detail;LUE deficits/detail RUE Deficits / Details: overall WFL; pt complains of pain with arthritic shoulder LUE Deficits / Details: hand, wrist, elbow AAROM WFL; block partially active   Lower Extremity Assessment Lower Extremity Assessment: Overall WFL for tasks assessed   Cervical / Trunk Assessment Cervical / Trunk Assessment: Other exceptions(hx of back surgery; cervical arthritis)   Communication Communication Communication: No difficulties   Cognition Arousal/Alertness: Awake/alert Behavior During Therapy: WFL for tasks assessed/performed Overall  Cognitive Status: Within Functional Limits for tasks assessed                                     General Comments       Exercises Exercises: Shoulder Shoulder Exercises Elbow Flexion: AAROM;Left;10 reps;Seated Elbow Extension: AAROM;Left;10 reps;Seated Wrist Flexion: AROM;Left;10 reps;Seated Wrist Extension: AROM;Left;10 reps;Seated Digit Composite Flexion: AROM;Left;10 reps;Seated Composite Extension: AROM;Left;Seated;10  reps Neck Flexion: AROM;5 reps;Seated Neck Extension: AROM;5 reps;Seated Neck Lateral Flexion - Right: AROM;5 reps;Seated Neck Lateral Flexion - Left: AROM;Seated;5 reps   Shoulder Instructions Shoulder Instructions Donning/doffing shirt without moving shoulder: Minimal assistance;Caregiver independent with task Method for sponge bathing under operated UE: Minimal assistance;Caregiver independent with task Donning/doffing sling/immobilizer: Minimal assistance;Caregiver independent with task Correct positioning of sling/immobilizer: Minimal assistance;Caregiver independent with task ROM for elbow, wrist and digits of operated UE: Minimal assistance;Caregiver independent with task Sling wearing schedule (on at all times/off for ADL's): Minimal assistance Proper positioning of operated UE when showering: Caregiver independent with task Dressing change: Patient able to independently direct caregiver Positioning of UE while sleeping: Caregiver independent with task    Home Living Family/patient expects to be discharged to:: Private residence Living Arrangements: Spouse/significant other Available Help at Discharge: Available 24 hours/day Type of Home: House Home Access: Stairs to enter Technical brewer of Steps: 2 Entrance Stairs-Rails: None Home Layout: One level     Bathroom Shower/Tub: Corporate investment banker: Standard Bathroom Accessibility: No   Home Equipment: Environmental consultant - 2 wheels          Prior Functioning/Environment Level of Independence: Independent        Comments: drives; retired Marine scientist        OT Problem List: Pain;Decreased strength;Decreased range of motion;Decreased knowledge of precautions      OT Treatment/Interventions:      OT Goals(Current goals can be found in the care plan section) Acute Rehab OT Goals Patient Stated Goal: to have better use of her arm OT Goal Formulation: All assessment and education complete, DC therapy   OT Frequency:     Barriers to D/C:            Co-evaluation              AM-PAC OT "6 Clicks" Daily Activity     Outcome Measure Help from another person eating meals?: A Little Help from another person taking care of personal grooming?: A Little Help from another person toileting, which includes using toliet, bedpan, or urinal?: A Little Help from another person bathing (including washing, rinsing, drying)?: A Little Help from another person to put on and taking off regular upper body clothing?: A Lot Help from another person to put on and taking off regular lower body clothing?: A Little 6 Click Score: 17   End of Session Equipment Utilized During Treatment: (sling) Nurse Communication: Mobility status;Other (comment)(ready for DC when medically stable)  Activity Tolerance: Patient tolerated treatment well Patient left: in chair;with call bell/phone within reach;with family/visitor present  OT Visit Diagnosis: Muscle weakness (generalized) (M62.81);Pain Pain - Right/Left: Left Pain - part of body: Shoulder                Time: 7858-8502 OT Time Calculation (min): 54 min Charges:  OT General Charges $OT Visit: 1 Visit OT Evaluation $OT Eval Low Complexity: 1 Low OT Treatments $Self Care/Home Management : 23-37 mins $Therapeutic Exercise: 8-22 mins  [facility],  OT/L   Acute OT Clinical Specialist Acute Rehabilitation Services Pager [phone] Office [phone]   [facility] 08/23/2019, 10:34 AM

## 2019-08-23 NOTE — Progress Notes (Signed)
     Subjective: 1 Day Post-Op s/p Procedure(s): TOTAL SHOULDER ARTHROPLASTY   Patient is alert, oriented, sitting comfortably in bed. Patient reports pain to be mild this morning.  Denies chest pain, SOB, Calf pain. No nausea/vomiting. No other complaints.    Objective:  PE: VITALS:   Vitals:   08/22/19 1636 08/22/19 2239 08/23/19 0138 08/23/19 0642  BP: 110/76 100/65 98/64 103/68  Pulse: 65 60 62 (!) 54  Resp: 16 14 16 16   Temp: 97.7 F (36.5 C) 97.9 F (36.6 C) 97.9 F (36.6 C) 98 F (36.7 C)  TempSrc: Oral Oral Oral Oral  SpO2: 100% 100% 100% 100%  Weight:      Height:        ABD soft Neurovascular intact Sensation intact distally Intact pulses distally Incision: dressing C/D/I  Able to flex, extend and abduct all fingers of left hand  LABS  Results for orders placed or performed during the hospital encounter of 08/22/19 (from the past 24 hour(s))  CBC     Status: Abnormal   Collection Time: 08/23/19  3:51 AM  Result Value Ref Range   WBC 6.6 4.0 - 10.5 K/uL   RBC 3.19 (L) 3.87 - 5.11 MIL/uL   Hemoglobin 10.1 (L) 12.0 - 15.0 g/dL   HCT 08/25/19 (L) 29.5 - 62.1 %   MCV 98.1 80.0 - 100.0 fL   MCH 31.7 26.0 - 34.0 pg   MCHC 32.3 30.0 - 36.0 g/dL   RDW 30.8 65.7 - 84.6 %   Platelets 129 (L) 150 - 400 K/uL   nRBC 0.0 0.0 - 0.2 %  Basic metabolic panel     Status: Abnormal   Collection Time: 08/23/19  3:51 AM  Result Value Ref Range   Sodium 140 135 - 145 mmol/L   Potassium 4.1 3.5 - 5.1 mmol/L   Chloride 106 98 - 111 mmol/L   CO2 29 22 - 32 mmol/L   Glucose, Bld 112 (H) 70 - 99 mg/dL   BUN 12 8 - 23 mg/dL   Creatinine, Ser 08/25/19 0.44 - 1.00 mg/dL   Calcium 8.3 (L) 8.9 - 10.3 mg/dL   GFR calc non Af Amer >60 >60 mL/min   GFR calc Af Amer >60 >60 mL/min   Anion gap 5 5 - 15    DG Shoulder Left Port  Result Date: 08/22/2019 CLINICAL DATA:  Status post left shoulder arthroplasty. EXAM: LEFT SHOULDER COMPARISON:  June 30, 2019. FINDINGS: The left  glenoid and humeral components appear to be well situated. No fracture or dislocation is noted. Ribs appear normal. IMPRESSION: Status post left shoulder arthroplasty. Electronically Signed   By: July 02, 2019 M.D.   On: 08/22/2019 12:57    Assessment/Plan: Principal Problem:   Osteoarthritis of left shoulder  1 Day Post-Op s/p Procedure(s): TOTAL SHOULDER ARTHROPLASTY  Weightbearing: NWB LUE,  Insicional and dressing care: PRN dressing changes, likely ok to keep dressing intact until discharge Orthopedic device(s): sling - abdominal strap had been placed over shoulder and patient was very uncomfortable, this was corrected VTE prophylaxis: SCDS, early ambulation Pain control: continue current regimen, will discharge with norco 10/325 Follow - up plan: Follow up with Dr. 10-05-1981 in 2 weeks Dispo: Plan to discharge home today after OT eval Contact information:   Weekdays 8-5 01-05-2006, PA-C [phone] A fter hours and holidays please check [email or web address] for group call information for [facility]  [phone] 08/23/2019, 8:12 AM

## 2019-08-23 NOTE — Plan of Care (Signed)
Patient discharged home in stable condition 

## 2019-09-04 DIAGNOSIS — M19012 Primary osteoarthritis, left shoulder: Secondary | ICD-10-CM | POA: Diagnosis not present

## 2019-10-04 DIAGNOSIS — M19012 Primary osteoarthritis, left shoulder: Secondary | ICD-10-CM | POA: Diagnosis not present

## 2019-10-09 DIAGNOSIS — M25612 Stiffness of left shoulder, not elsewhere classified: Secondary | ICD-10-CM | POA: Diagnosis not present

## 2019-10-09 DIAGNOSIS — M6281 Muscle weakness (generalized): Secondary | ICD-10-CM | POA: Diagnosis not present

## 2019-10-09 DIAGNOSIS — M25512 Pain in left shoulder: Secondary | ICD-10-CM | POA: Diagnosis not present

## 2019-10-13 DIAGNOSIS — M25512 Pain in left shoulder: Secondary | ICD-10-CM | POA: Diagnosis not present

## 2019-10-13 DIAGNOSIS — M25612 Stiffness of left shoulder, not elsewhere classified: Secondary | ICD-10-CM | POA: Diagnosis not present

## 2019-10-13 DIAGNOSIS — M6281 Muscle weakness (generalized): Secondary | ICD-10-CM | POA: Diagnosis not present

## 2019-10-17 DIAGNOSIS — M25612 Stiffness of left shoulder, not elsewhere classified: Secondary | ICD-10-CM | POA: Diagnosis not present

## 2019-10-17 DIAGNOSIS — M6281 Muscle weakness (generalized): Secondary | ICD-10-CM | POA: Diagnosis not present

## 2019-10-17 DIAGNOSIS — M25512 Pain in left shoulder: Secondary | ICD-10-CM | POA: Diagnosis not present

## 2019-10-19 DIAGNOSIS — M25612 Stiffness of left shoulder, not elsewhere classified: Secondary | ICD-10-CM | POA: Diagnosis not present

## 2019-10-19 DIAGNOSIS — M25512 Pain in left shoulder: Secondary | ICD-10-CM | POA: Diagnosis not present

## 2019-10-19 DIAGNOSIS — M6281 Muscle weakness (generalized): Secondary | ICD-10-CM | POA: Diagnosis not present

## 2019-10-24 DIAGNOSIS — M25512 Pain in left shoulder: Secondary | ICD-10-CM | POA: Diagnosis not present

## 2019-10-24 DIAGNOSIS — M25612 Stiffness of left shoulder, not elsewhere classified: Secondary | ICD-10-CM | POA: Diagnosis not present

## 2019-10-24 DIAGNOSIS — M6281 Muscle weakness (generalized): Secondary | ICD-10-CM | POA: Diagnosis not present

## 2019-10-26 DIAGNOSIS — M6281 Muscle weakness (generalized): Secondary | ICD-10-CM | POA: Diagnosis not present

## 2019-10-26 DIAGNOSIS — M25512 Pain in left shoulder: Secondary | ICD-10-CM | POA: Diagnosis not present

## 2019-10-26 DIAGNOSIS — M25612 Stiffness of left shoulder, not elsewhere classified: Secondary | ICD-10-CM | POA: Diagnosis not present

## 2019-10-31 DIAGNOSIS — M6281 Muscle weakness (generalized): Secondary | ICD-10-CM | POA: Diagnosis not present

## 2019-10-31 DIAGNOSIS — M25612 Stiffness of left shoulder, not elsewhere classified: Secondary | ICD-10-CM | POA: Diagnosis not present

## 2019-10-31 DIAGNOSIS — M25512 Pain in left shoulder: Secondary | ICD-10-CM | POA: Diagnosis not present

## 2019-11-01 DIAGNOSIS — M19012 Primary osteoarthritis, left shoulder: Secondary | ICD-10-CM | POA: Diagnosis not present

## 2019-11-02 DIAGNOSIS — M25612 Stiffness of left shoulder, not elsewhere classified: Secondary | ICD-10-CM | POA: Diagnosis not present

## 2019-11-02 DIAGNOSIS — M25512 Pain in left shoulder: Secondary | ICD-10-CM | POA: Diagnosis not present

## 2019-11-02 DIAGNOSIS — M6281 Muscle weakness (generalized): Secondary | ICD-10-CM | POA: Diagnosis not present

## 2019-11-07 DIAGNOSIS — M25612 Stiffness of left shoulder, not elsewhere classified: Secondary | ICD-10-CM | POA: Diagnosis not present

## 2019-11-07 DIAGNOSIS — M6281 Muscle weakness (generalized): Secondary | ICD-10-CM | POA: Diagnosis not present

## 2019-11-07 DIAGNOSIS — M25512 Pain in left shoulder: Secondary | ICD-10-CM | POA: Diagnosis not present

## 2019-11-14 DIAGNOSIS — M25512 Pain in left shoulder: Secondary | ICD-10-CM | POA: Diagnosis not present

## 2019-11-14 DIAGNOSIS — M6281 Muscle weakness (generalized): Secondary | ICD-10-CM | POA: Diagnosis not present

## 2019-11-14 DIAGNOSIS — M25612 Stiffness of left shoulder, not elsewhere classified: Secondary | ICD-10-CM | POA: Diagnosis not present

## 2019-11-16 DIAGNOSIS — M25512 Pain in left shoulder: Secondary | ICD-10-CM | POA: Diagnosis not present

## 2019-11-16 DIAGNOSIS — M6281 Muscle weakness (generalized): Secondary | ICD-10-CM | POA: Diagnosis not present

## 2019-11-16 DIAGNOSIS — M25612 Stiffness of left shoulder, not elsewhere classified: Secondary | ICD-10-CM | POA: Diagnosis not present

## 2019-11-21 DIAGNOSIS — M6281 Muscle weakness (generalized): Secondary | ICD-10-CM | POA: Diagnosis not present

## 2019-11-21 DIAGNOSIS — M25512 Pain in left shoulder: Secondary | ICD-10-CM | POA: Diagnosis not present

## 2019-11-21 DIAGNOSIS — M25612 Stiffness of left shoulder, not elsewhere classified: Secondary | ICD-10-CM | POA: Diagnosis not present

## 2019-11-23 DIAGNOSIS — M25512 Pain in left shoulder: Secondary | ICD-10-CM | POA: Diagnosis not present

## 2019-11-23 DIAGNOSIS — M25612 Stiffness of left shoulder, not elsewhere classified: Secondary | ICD-10-CM | POA: Diagnosis not present

## 2019-11-23 DIAGNOSIS — M6281 Muscle weakness (generalized): Secondary | ICD-10-CM | POA: Diagnosis not present

## 2019-11-28 DIAGNOSIS — M25612 Stiffness of left shoulder, not elsewhere classified: Secondary | ICD-10-CM | POA: Diagnosis not present

## 2019-11-28 DIAGNOSIS — M25512 Pain in left shoulder: Secondary | ICD-10-CM | POA: Diagnosis not present

## 2019-11-28 DIAGNOSIS — M6281 Muscle weakness (generalized): Secondary | ICD-10-CM | POA: Diagnosis not present

## 2019-11-30 DIAGNOSIS — M25512 Pain in left shoulder: Secondary | ICD-10-CM | POA: Diagnosis not present

## 2019-11-30 DIAGNOSIS — M25612 Stiffness of left shoulder, not elsewhere classified: Secondary | ICD-10-CM | POA: Diagnosis not present

## 2019-11-30 DIAGNOSIS — M6281 Muscle weakness (generalized): Secondary | ICD-10-CM | POA: Diagnosis not present

## 2019-12-06 DIAGNOSIS — M25512 Pain in left shoulder: Secondary | ICD-10-CM | POA: Diagnosis not present

## 2020-07-29 DIAGNOSIS — Z136 Encounter for screening for cardiovascular disorders: Secondary | ICD-10-CM | POA: Diagnosis not present

## 2020-07-29 DIAGNOSIS — Z5181 Encounter for therapeutic drug level monitoring: Secondary | ICD-10-CM | POA: Diagnosis not present

## 2020-07-29 DIAGNOSIS — Z13220 Encounter for screening for lipoid disorders: Secondary | ICD-10-CM | POA: Diagnosis not present

## 2020-07-29 DIAGNOSIS — Z0000 Encounter for general adult medical examination without abnormal findings: Secondary | ICD-10-CM | POA: Diagnosis not present

## 2020-08-09 DIAGNOSIS — M542 Cervicalgia: Secondary | ICD-10-CM | POA: Diagnosis not present

## 2020-08-09 DIAGNOSIS — M25512 Pain in left shoulder: Secondary | ICD-10-CM | POA: Diagnosis not present

## 2020-09-06 DIAGNOSIS — M542 Cervicalgia: Secondary | ICD-10-CM | POA: Diagnosis not present

## 2020-10-07 DIAGNOSIS — T8484XA Pain due to internal orthopedic prosthetic devices, implants and grafts, initial encounter: Secondary | ICD-10-CM | POA: Diagnosis not present

## 2020-10-07 DIAGNOSIS — M542 Cervicalgia: Secondary | ICD-10-CM | POA: Diagnosis not present

## 2020-10-10 DIAGNOSIS — M542 Cervicalgia: Secondary | ICD-10-CM | POA: Diagnosis not present

## 2020-10-29 DIAGNOSIS — M25512 Pain in left shoulder: Secondary | ICD-10-CM | POA: Diagnosis not present

## 2020-10-29 DIAGNOSIS — M542 Cervicalgia: Secondary | ICD-10-CM | POA: Diagnosis not present

## 2020-11-21 DIAGNOSIS — R051 Acute cough: Secondary | ICD-10-CM | POA: Diagnosis not present

## 2020-11-21 DIAGNOSIS — R5383 Other fatigue: Secondary | ICD-10-CM | POA: Diagnosis not present

## 2020-11-21 DIAGNOSIS — R0981 Nasal congestion: Secondary | ICD-10-CM | POA: Diagnosis not present

## 2020-11-21 DIAGNOSIS — Z20822 Contact with and (suspected) exposure to covid-19: Secondary | ICD-10-CM | POA: Diagnosis not present

## 2020-11-27 DIAGNOSIS — M25512 Pain in left shoulder: Secondary | ICD-10-CM | POA: Diagnosis not present

## 2020-12-10 ENCOUNTER — Other Ambulatory Visit: Payer: Self-pay | Admitting: Orthopedic Surgery

## 2020-12-10 DIAGNOSIS — M25512 Pain in left shoulder: Secondary | ICD-10-CM

## 2020-12-30 ENCOUNTER — Ambulatory Visit
Admission: RE | Admit: 2020-12-30 | Discharge: 2020-12-30 | Disposition: A | Discharge status: Home / Self Care | Payer: Medicare HMO | Source: Ambulatory Visit | Attending: Orthopedic Surgery | Admitting: Orthopedic Surgery

## 2020-12-30 ENCOUNTER — Other Ambulatory Visit: Payer: Self-pay

## 2020-12-30 DIAGNOSIS — M25512 Pain in left shoulder: Secondary | ICD-10-CM

## 2020-12-30 MED ORDER — IOPAMIDOL (ISOVUE-M 200) INJECTION 41%: 13.0000 mL | Freq: Once | INTRAMUSCULAR | Status: DC

## 2021-01-03 DIAGNOSIS — T8484XD Pain due to internal orthopedic prosthetic devices, implants and grafts, subsequent encounter: Secondary | ICD-10-CM | POA: Diagnosis not present

## 2021-04-07 DIAGNOSIS — T8484XD Pain due to internal orthopedic prosthetic devices, implants and grafts, subsequent encounter: Secondary | ICD-10-CM | POA: Diagnosis not present

## 2021-05-07 ENCOUNTER — Ambulatory Visit: Payer: Medicare HMO | Admitting: Podiatry

## 2021-05-07 ENCOUNTER — Other Ambulatory Visit: Payer: Self-pay

## 2021-05-07 ENCOUNTER — Encounter: Payer: Self-pay | Admitting: Podiatry

## 2021-05-07 DIAGNOSIS — L600 Ingrowing nail: Secondary | ICD-10-CM

## 2021-05-07 DIAGNOSIS — L03032 Cellulitis of left toe: Secondary | ICD-10-CM

## 2021-05-07 NOTE — Patient Instructions (Signed)

## 2021-05-08 NOTE — Progress Notes (Signed)
Subjective:   Patient ID: Melanie Golden, female   DOB: 71 y.o.   MRN: [id number]   HPI Patient presents stating that she traumatized her left big toe several months ago and its been very sore with drainage and that she has discoloration of nailbeds that she is also concerned about and would like to discuss.  States its been very sore and making it hard to wear shoe gear properly and patient does not smoke likes to be active   Review of Systems  All other systems reviewed and are negative.      Objective:  Physical Exam Vitals and nursing note reviewed.  Constitutional:      Appearance: She is well-developed.  Pulmonary:     Effort: Pulmonary effort is normal.  Musculoskeletal:        General: Normal range of motion.  Skin:    General: Skin is warm.  Neurological:     Mental Status: She is alert.    Neurovascular status was found to be intact muscle strength was found to be adequate range of motion is within normal limits with mild redness to her feet which is normal for her.  Patient does have mild discoloration of nailbeds bilateral and the left hallux does have looseness to the bed redness in the proximal portion and soreness when pressed with no active drainage noted currently.  Patient has good digital perfusion well oriented x3     Assessment:  Probability for infection of the left hallux secondary to trauma with nailbed being affected and also overall fungal infection of the nailbeds of a low-grade nature     Plan:  H&P reviewed both conditions with patient.  Do not recommend current treatment of overall fungus as its not bothersome for her and she can take care of it herself.  For the nailbed I did recommend removal but allowing regrowth explaining it may regrow abnormally ultimately may require a permanent procedure.  I infiltrated the left hallux 60 mg like Marcaine mixture sterile prep applied and using sterile instrumentation remove the left hallux nail.  I did clean out  the bed flushed it did not note any active drainage currently and applied sterile dressing with instructions for soaks and to leave dressing on 24 hours but take it off earlier if any throbbing were to occur.  This should resolve if not we will consider antibiotics but since I did not see active drainage I do not think it is necessary

## 2021-08-25 DIAGNOSIS — N393 Stress incontinence (female) (male): Secondary | ICD-10-CM | POA: Diagnosis not present

## 2021-08-25 DIAGNOSIS — Z13820 Encounter for screening for osteoporosis: Secondary | ICD-10-CM | POA: Diagnosis not present

## 2021-08-25 DIAGNOSIS — M47812 Spondylosis without myelopathy or radiculopathy, cervical region: Secondary | ICD-10-CM | POA: Diagnosis not present

## 2021-08-25 DIAGNOSIS — M129 Arthropathy, unspecified: Secondary | ICD-10-CM | POA: Diagnosis not present

## 2021-08-25 DIAGNOSIS — Z1211 Encounter for screening for malignant neoplasm of colon: Secondary | ICD-10-CM | POA: Diagnosis not present

## 2021-08-25 DIAGNOSIS — R7301 Impaired fasting glucose: Secondary | ICD-10-CM | POA: Diagnosis not present

## 2021-08-25 DIAGNOSIS — Z0000 Encounter for general adult medical examination without abnormal findings: Secondary | ICD-10-CM | POA: Diagnosis not present

## 2021-08-25 DIAGNOSIS — Z13220 Encounter for screening for lipoid disorders: Secondary | ICD-10-CM | POA: Diagnosis not present

## 2021-08-25 DIAGNOSIS — Z780 Asymptomatic menopausal state: Secondary | ICD-10-CM | POA: Diagnosis not present

## 2021-08-25 DIAGNOSIS — Z5181 Encounter for therapeutic drug level monitoring: Secondary | ICD-10-CM | POA: Diagnosis not present

## 2021-08-25 DIAGNOSIS — Z136 Encounter for screening for cardiovascular disorders: Secondary | ICD-10-CM | POA: Diagnosis not present

## 2021-08-28 ENCOUNTER — Other Ambulatory Visit: Payer: Self-pay | Admitting: Family Medicine

## 2021-08-28 DIAGNOSIS — Z13820 Encounter for screening for osteoporosis: Secondary | ICD-10-CM

## 2021-08-28 DIAGNOSIS — R509 Fever, unspecified: Secondary | ICD-10-CM | POA: Diagnosis not present

## 2021-08-28 DIAGNOSIS — R52 Pain, unspecified: Secondary | ICD-10-CM | POA: Diagnosis not present

## 2021-10-15 DIAGNOSIS — T8484XD Pain due to internal orthopedic prosthetic devices, implants and grafts, subsequent encounter: Secondary | ICD-10-CM | POA: Diagnosis not present

## 2022-06-02 IMAGING — CT CT SHOULDER*L* W/CM
2 series · 10 of 14 positions shown, 12 images · non-contrast
Comparison: Left shoulder radiograph 08/23/2019

CLINICAL DATA: Left shoulder pain

EXAM:
CT ARTHROGRAPHY OF THE left shoulder
TECHNIQUE: Multidetector CT imaging was performed following the standard
protocol after injection of dilute contrast into the joint.

[Series 2: bone · axial · 0.47mm/px · z∈[-184,-50]mm · 5 of 101 slices shown, 7 images]
[im 17/101  soft-tissue]
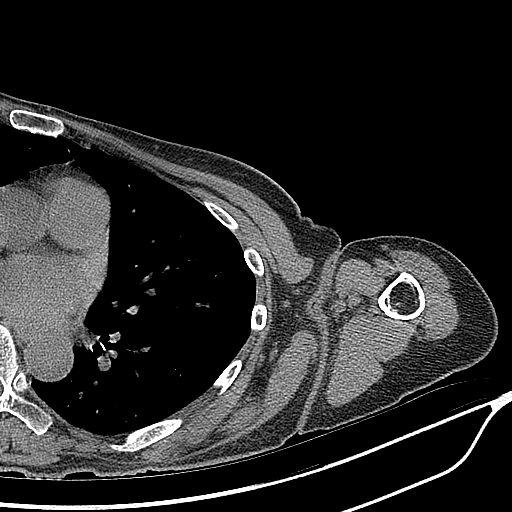
[im 17/101  bone]
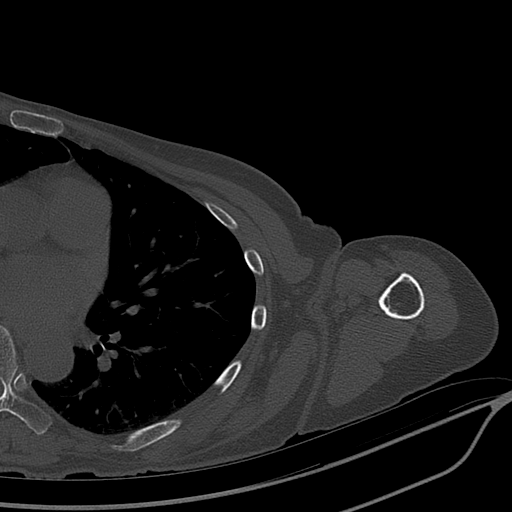
[im 34/101  bone]
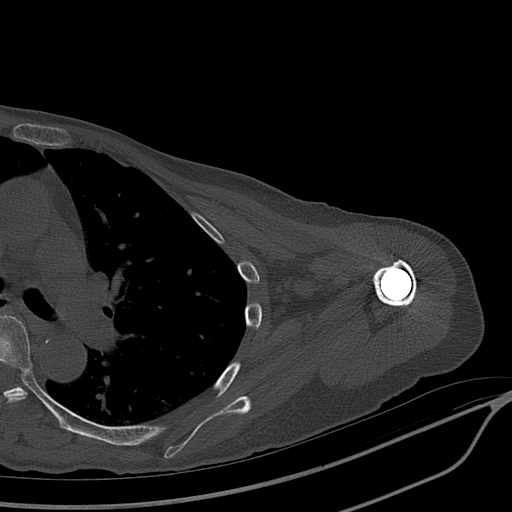
[im 51/101  bone]
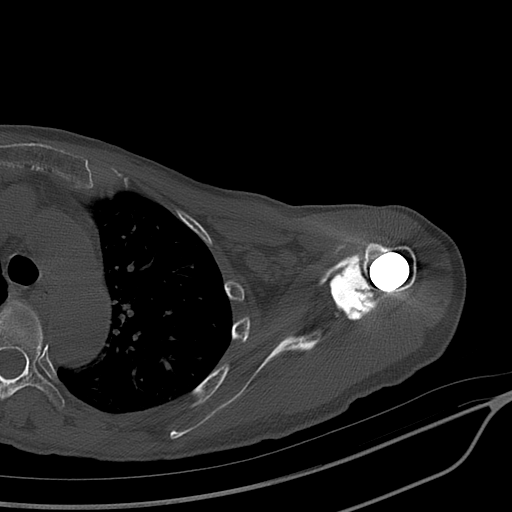
[im 67/101  bone]
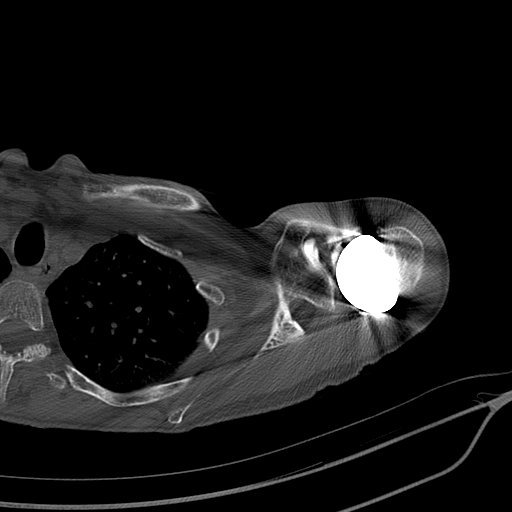
[im 84/101  soft-tissue]
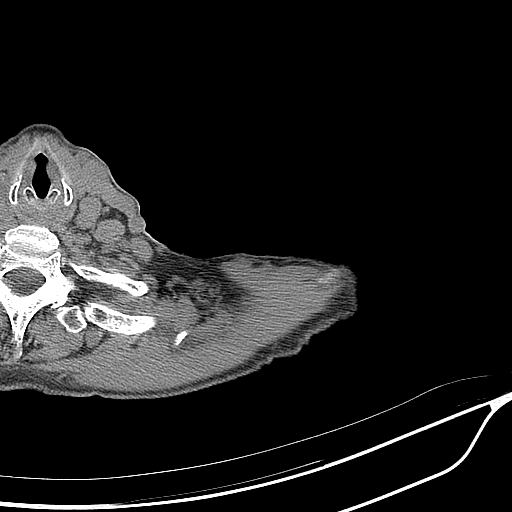
[im 84/101  bone]
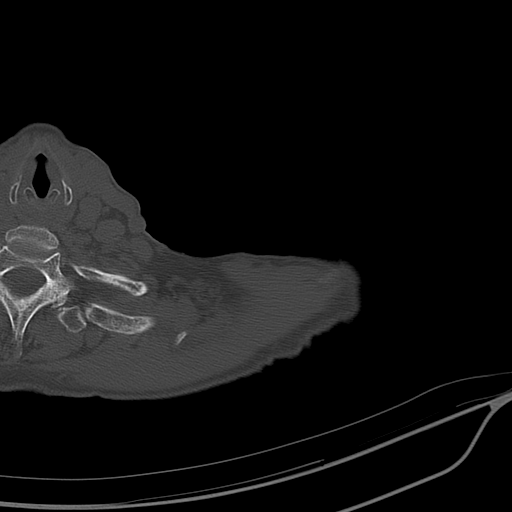

[Series 5: soft tissue (person_name) · axial · 0.47mm/px · z∈[-180,-50]mm · 5 of 99 slices shown]
[im 17/99  soft-tissue]
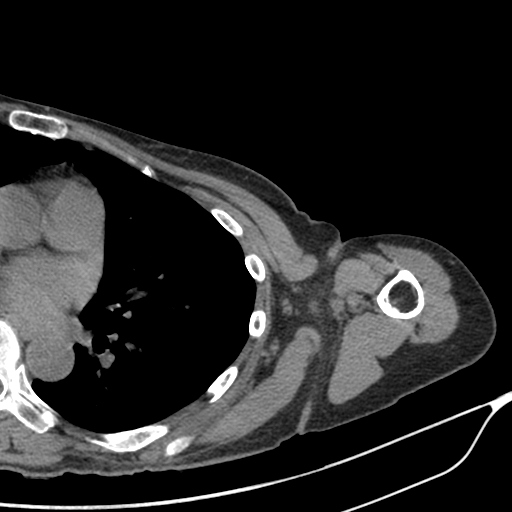
[im 33/99  soft-tissue]
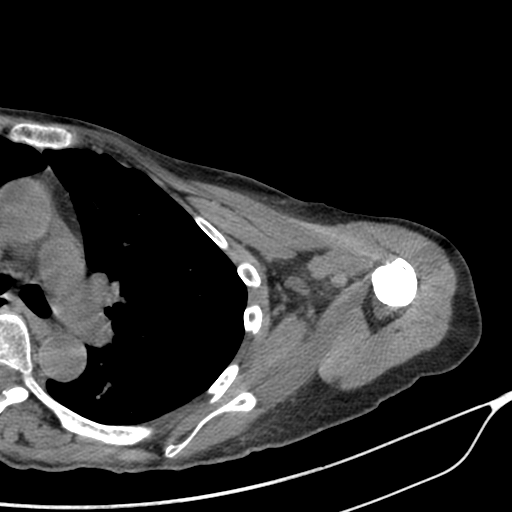
[im 50/99  soft-tissue]
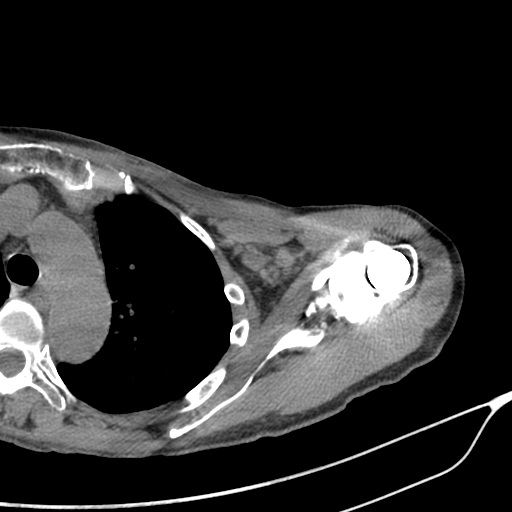
[im 66/99  soft-tissue]
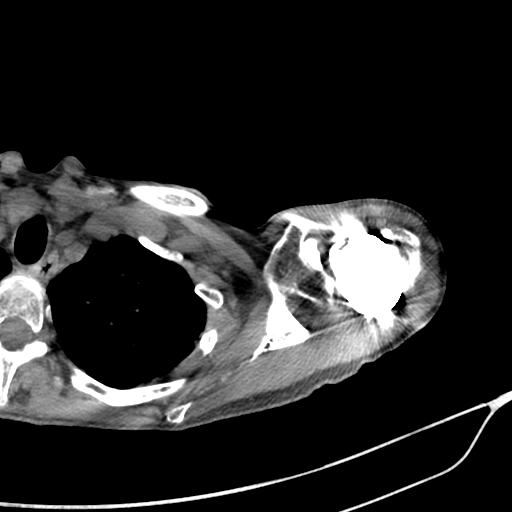
[im 82/99  soft-tissue]
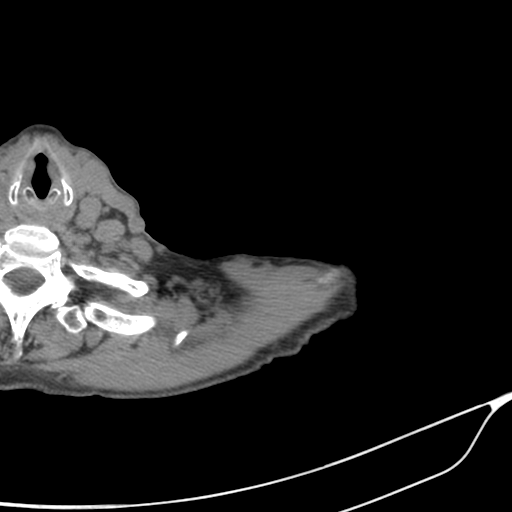

[10 of 14 positions shown; findings below may reference images not displayed]

FINDINGS: Postsurgical changes of anatomic left shoulder arthroplasty, with
mild associated streak artifact. Hardware is intact without evidence
of loosening or periprosthetic fracture.

There is no evidence of full-thickness rotator cuff tear. There is
minimal interstitial fluid dissecting through the subscapularis
tendon which could represent low-grade interstitial tearing or from
injection technique. The supraspinatus, infraspinatus, and teres
minor appear intact. No significant muscle atrophy.

There is synovial irregularity suggesting synovitis.

There is no focal fluid collection.
IMPRESSION: Postsurgical changes of anatomic left shoulder arthroplasty. No
evidence of hardware complication.

No evidence of full-thickness cuff tear. Minimal interstitial fluid
dissecting within the distal subscapularis tendon, could represent
low-grade interstitial tearing or could be due to injection
technique. No muscle atrophy.

Synovial irregularity suggesting synovitis.

## 2022-06-02 IMAGING — XA DG FLUORO GUIDE NDL PLC/BX
1 series · 1 of 1 positions shown · non-contrast
Comparison: none

CLINICAL DATA: Pain post shoulder arthroplasty

EXAM:
EXAM
LEFT SHOULDER INJECTION UNDER FLUOROSCOPY FOR MRI
FLUOROSCOPY TIME:  35 seconds; 8 uUymU DAP
TECHNIQUE: The procedure, risks (including but not limited to bleeding,
infection, organ damage ), benefits, and alternatives were explained
to the patient. Questions regarding the procedure were encouraged
and answered. The patient understands and consents to the procedure.

[Series 1: ortho standard · 1 of 1 slices shown]
[im 1/1]
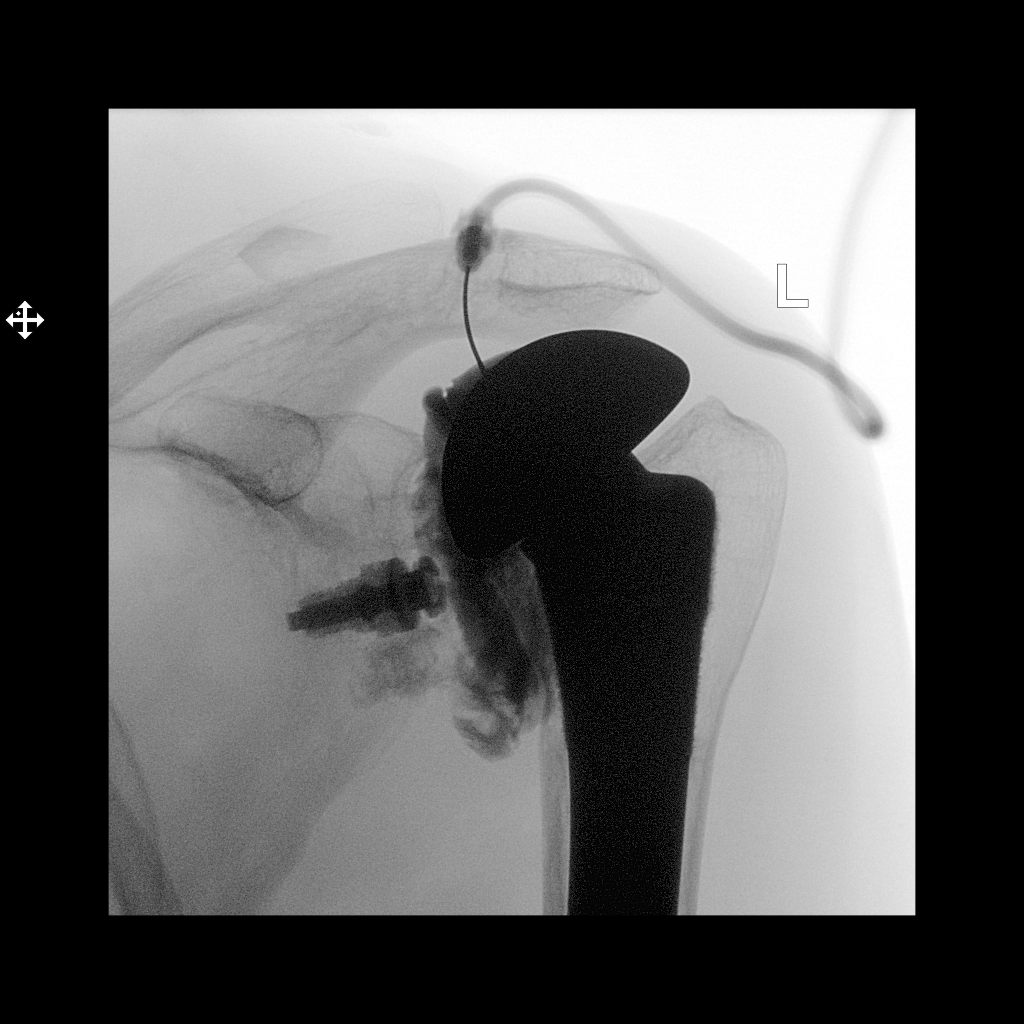

[1 of 1 positions shown; findings below may reference images not displayed]

An appropriate skin entry site was determined under fluoroscopy.
Skin site was marked, prepped with Betadine, and draped in usual
sterile fashion, and infiltrated locally with 1% lidocaine.

22-gauge spinal needle advanced to the superior medial margin of the
humeral head prosthesis. 1 mL of lidocaine 1% injected easily. 14 mL
mixture of dilute iodinated contrast was injected into the shoulder
joint. Intraarticular flow was confirmed on fluoroscopy. Patient
transferred to CT.

COMPLICATIONS:
COMPLICATIONS
none
IMPRESSION: 1. Technically successful left shoulder injection for CT.

## 2022-07-24 DIAGNOSIS — R1032 Left lower quadrant pain: Secondary | ICD-10-CM | POA: Diagnosis not present

## 2022-07-24 DIAGNOSIS — R198 Other specified symptoms and signs involving the digestive system and abdomen: Secondary | ICD-10-CM | POA: Diagnosis not present

## 2022-07-27 ENCOUNTER — Encounter: Payer: Self-pay | Admitting: Family Medicine

## 2022-07-28 ENCOUNTER — Other Ambulatory Visit: Payer: Self-pay | Admitting: Family Medicine

## 2022-07-28 DIAGNOSIS — R1032 Left lower quadrant pain: Secondary | ICD-10-CM

## 2022-08-04 ENCOUNTER — Other Ambulatory Visit: Payer: Self-pay | Admitting: Family Medicine

## 2022-08-04 DIAGNOSIS — Z1231 Encounter for screening mammogram for malignant neoplasm of breast: Secondary | ICD-10-CM

## 2022-08-04 DIAGNOSIS — Z13820 Encounter for screening for osteoporosis: Secondary | ICD-10-CM

## 2022-08-12 ENCOUNTER — Other Ambulatory Visit: Payer: Self-pay | Admitting: Family Medicine

## 2022-08-12 DIAGNOSIS — R1032 Left lower quadrant pain: Secondary | ICD-10-CM

## 2022-08-28 DIAGNOSIS — E559 Vitamin D deficiency, unspecified: Secondary | ICD-10-CM | POA: Diagnosis not present

## 2022-08-28 DIAGNOSIS — N393 Stress incontinence (female) (male): Secondary | ICD-10-CM | POA: Diagnosis not present

## 2022-08-28 DIAGNOSIS — M150 Primary generalized (osteo)arthritis: Secondary | ICD-10-CM | POA: Diagnosis not present

## 2022-08-28 DIAGNOSIS — Z1159 Encounter for screening for other viral diseases: Secondary | ICD-10-CM | POA: Diagnosis not present

## 2022-08-28 DIAGNOSIS — Z136 Encounter for screening for cardiovascular disorders: Secondary | ICD-10-CM | POA: Diagnosis not present

## 2022-08-28 DIAGNOSIS — Z0000 Encounter for general adult medical examination without abnormal findings: Secondary | ICD-10-CM | POA: Diagnosis not present

## 2022-08-28 DIAGNOSIS — M47812 Spondylosis without myelopathy or radiculopathy, cervical region: Secondary | ICD-10-CM | POA: Diagnosis not present

## 2022-08-28 DIAGNOSIS — M797 Fibromyalgia: Secondary | ICD-10-CM | POA: Diagnosis not present

## 2022-09-16 ENCOUNTER — Ambulatory Visit
Admission: RE | Admit: 2022-09-16 | Discharge: 2022-09-16 | Disposition: A | Discharge status: Home / Self Care | Payer: HMO | Source: Ambulatory Visit | Attending: Family Medicine | Admitting: Family Medicine

## 2022-09-16 DIAGNOSIS — R1032 Left lower quadrant pain: Secondary | ICD-10-CM

## 2022-09-16 DIAGNOSIS — R1907 Generalized intra-abdominal and pelvic swelling, mass and lump: Secondary | ICD-10-CM | POA: Diagnosis not present

## 2022-09-17 ENCOUNTER — Ambulatory Visit
Admission: RE | Admit: 2022-09-17 | Discharge: 2022-09-17 | Disposition: A | Discharge status: Home / Self Care | Payer: Self-pay | Source: Ambulatory Visit | Attending: Family Medicine | Admitting: Family Medicine

## 2022-09-17 DIAGNOSIS — Z1231 Encounter for screening mammogram for malignant neoplasm of breast: Secondary | ICD-10-CM

## 2022-10-07 ENCOUNTER — Other Ambulatory Visit (HOSPITAL_BASED_OUTPATIENT_CLINIC_OR_DEPARTMENT_OTHER): Payer: Self-pay | Admitting: Gastroenterology

## 2022-10-07 DIAGNOSIS — R1084 Generalized abdominal pain: Secondary | ICD-10-CM | POA: Diagnosis not present

## 2022-10-07 DIAGNOSIS — K5904 Chronic idiopathic constipation: Secondary | ICD-10-CM | POA: Diagnosis not present

## 2022-10-14 ENCOUNTER — Ambulatory Visit (HOSPITAL_BASED_OUTPATIENT_CLINIC_OR_DEPARTMENT_OTHER)
Admission: RE | Admit: 2022-10-14 | Discharge: 2022-10-14 | Disposition: A | Discharge status: Home / Self Care | Payer: HMO | Source: Ambulatory Visit | Attending: Gastroenterology | Admitting: Gastroenterology

## 2022-10-14 DIAGNOSIS — K5904 Chronic idiopathic constipation: Secondary | ICD-10-CM | POA: Insufficient documentation

## 2022-10-14 DIAGNOSIS — R1032 Left lower quadrant pain: Secondary | ICD-10-CM | POA: Diagnosis not present

## 2022-10-14 DIAGNOSIS — R1084 Generalized abdominal pain: Secondary | ICD-10-CM | POA: Diagnosis not present

## 2022-10-14 LAB — POCT I-STAT CREATININE: Creatinine, Ser: 1 mg/dL (ref 0.44–1.00)

## 2022-10-14 MED ORDER — IOHEXOL 300 MG/ML  SOLN
100.0000 mL | Freq: Once | INTRAMUSCULAR | Status: AC | PRN
  Administered 2022-10-14: 75 mL via INTRAVENOUS

## 2022-10-16 DIAGNOSIS — T8484XD Pain due to internal orthopedic prosthetic devices, implants and grafts, subsequent encounter: Secondary | ICD-10-CM | POA: Diagnosis not present

## 2022-11-17 DIAGNOSIS — K5730 Diverticulosis of large intestine without perforation or abscess without bleeding: Secondary | ICD-10-CM | POA: Diagnosis not present

## 2022-11-17 DIAGNOSIS — K6389 Other specified diseases of intestine: Secondary | ICD-10-CM | POA: Diagnosis not present

## 2022-11-17 DIAGNOSIS — D122 Benign neoplasm of ascending colon: Secondary | ICD-10-CM | POA: Diagnosis not present

## 2022-11-17 DIAGNOSIS — D124 Benign neoplasm of descending colon: Secondary | ICD-10-CM | POA: Diagnosis not present

## 2022-11-17 DIAGNOSIS — R1084 Generalized abdominal pain: Secondary | ICD-10-CM | POA: Diagnosis not present

## 2022-11-17 DIAGNOSIS — K648 Other hemorrhoids: Secondary | ICD-10-CM | POA: Diagnosis not present

## 2022-11-19 DIAGNOSIS — K6389 Other specified diseases of intestine: Secondary | ICD-10-CM | POA: Diagnosis not present

## 2022-11-19 DIAGNOSIS — D124 Benign neoplasm of descending colon: Secondary | ICD-10-CM | POA: Diagnosis not present

## 2022-11-19 DIAGNOSIS — D122 Benign neoplasm of ascending colon: Secondary | ICD-10-CM | POA: Diagnosis not present

## 2023-02-11 ENCOUNTER — Other Ambulatory Visit: Payer: Medicare HMO

## 2023-07-30 ENCOUNTER — Ambulatory Visit
Admission: RE | Admit: 2023-07-30 | Discharge: 2023-07-30 | Disposition: A | Discharge status: Home / Self Care | Source: Ambulatory Visit | Attending: Gastroenterology | Admitting: Gastroenterology

## 2023-07-30 ENCOUNTER — Encounter: Payer: Self-pay | Admitting: Gastroenterology

## 2023-07-30 ENCOUNTER — Other Ambulatory Visit: Payer: Self-pay | Admitting: Gastroenterology

## 2023-07-30 DIAGNOSIS — K5904 Chronic idiopathic constipation: Secondary | ICD-10-CM

## 2023-07-30 DIAGNOSIS — R140 Abdominal distension (gaseous): Secondary | ICD-10-CM | POA: Diagnosis not present

## 2023-08-31 DIAGNOSIS — M797 Fibromyalgia: Secondary | ICD-10-CM | POA: Diagnosis not present

## 2023-08-31 DIAGNOSIS — M47812 Spondylosis without myelopathy or radiculopathy, cervical region: Secondary | ICD-10-CM | POA: Diagnosis not present

## 2023-08-31 DIAGNOSIS — R600 Localized edema: Secondary | ICD-10-CM | POA: Diagnosis not present

## 2023-08-31 DIAGNOSIS — M150 Primary generalized (osteo)arthritis: Secondary | ICD-10-CM | POA: Diagnosis not present

## 2023-08-31 DIAGNOSIS — R7301 Impaired fasting glucose: Secondary | ICD-10-CM | POA: Diagnosis not present

## 2023-08-31 DIAGNOSIS — Z23 Encounter for immunization: Secondary | ICD-10-CM | POA: Diagnosis not present

## 2023-08-31 DIAGNOSIS — Z1331 Encounter for screening for depression: Secondary | ICD-10-CM | POA: Diagnosis not present

## 2023-08-31 DIAGNOSIS — E559 Vitamin D deficiency, unspecified: Secondary | ICD-10-CM | POA: Diagnosis not present

## 2023-08-31 DIAGNOSIS — Z0000 Encounter for general adult medical examination without abnormal findings: Secondary | ICD-10-CM | POA: Diagnosis not present

## 2023-08-31 DIAGNOSIS — Z13220 Encounter for screening for lipoid disorders: Secondary | ICD-10-CM | POA: Diagnosis not present

## 2023-08-31 DIAGNOSIS — Z136 Encounter for screening for cardiovascular disorders: Secondary | ICD-10-CM | POA: Diagnosis not present

## 2023-09-13 DIAGNOSIS — K5904 Chronic idiopathic constipation: Secondary | ICD-10-CM | POA: Diagnosis not present

## 2023-09-13 DIAGNOSIS — Z860101 Personal history of adenomatous and serrated colon polyps: Secondary | ICD-10-CM | POA: Diagnosis not present

## 2023-09-15 DIAGNOSIS — T8484XD Pain due to internal orthopedic prosthetic devices, implants and grafts, subsequent encounter: Secondary | ICD-10-CM | POA: Diagnosis not present

## 2023-09-15 DIAGNOSIS — M25512 Pain in left shoulder: Secondary | ICD-10-CM | POA: Diagnosis not present

## 2023-09-17 ENCOUNTER — Other Ambulatory Visit: Payer: Self-pay | Admitting: Orthopedic Surgery

## 2023-09-17 DIAGNOSIS — Z01818 Encounter for other preprocedural examination: Secondary | ICD-10-CM

## 2023-09-17 DIAGNOSIS — G8929 Other chronic pain: Secondary | ICD-10-CM

## 2023-09-22 DIAGNOSIS — H04123 Dry eye syndrome of bilateral lacrimal glands: Secondary | ICD-10-CM | POA: Diagnosis not present

## 2023-09-22 DIAGNOSIS — H2513 Age-related nuclear cataract, bilateral: Secondary | ICD-10-CM | POA: Diagnosis not present

## 2023-09-28 DIAGNOSIS — M25512 Pain in left shoulder: Secondary | ICD-10-CM | POA: Diagnosis not present

## 2023-09-29 ENCOUNTER — Ambulatory Visit
Admission: RE | Admit: 2023-09-29 | Discharge: 2023-09-29 | Disposition: A | Discharge status: Home / Self Care | Source: Ambulatory Visit | Attending: Orthopedic Surgery | Admitting: Orthopedic Surgery

## 2023-09-29 DIAGNOSIS — Z01818 Encounter for other preprocedural examination: Secondary | ICD-10-CM

## 2023-09-29 DIAGNOSIS — Z96612 Presence of left artificial shoulder joint: Secondary | ICD-10-CM | POA: Diagnosis not present

## 2023-09-29 DIAGNOSIS — G8929 Other chronic pain: Secondary | ICD-10-CM

## 2023-10-22 DIAGNOSIS — M25512 Pain in left shoulder: Secondary | ICD-10-CM | POA: Diagnosis not present

## 2023-12-10 ENCOUNTER — Encounter (HOSPITAL_BASED_OUTPATIENT_CLINIC_OR_DEPARTMENT_OTHER): Payer: Self-pay | Admitting: Emergency Medicine

## 2023-12-10 ENCOUNTER — Emergency Department (HOSPITAL_BASED_OUTPATIENT_CLINIC_OR_DEPARTMENT_OTHER)
Admission: EM | Admit: 2023-12-10 | Discharge: 2023-12-10 | Disposition: A | Discharge status: Home / Self Care | Source: Ambulatory Visit | Attending: Emergency Medicine | Admitting: Emergency Medicine

## 2023-12-10 ENCOUNTER — Other Ambulatory Visit: Payer: Self-pay

## 2023-12-10 ENCOUNTER — Emergency Department (HOSPITAL_BASED_OUTPATIENT_CLINIC_OR_DEPARTMENT_OTHER)

## 2023-12-10 DIAGNOSIS — K5900 Constipation, unspecified: Secondary | ICD-10-CM | POA: Diagnosis not present

## 2023-12-10 DIAGNOSIS — K5730 Diverticulosis of large intestine without perforation or abscess without bleeding: Secondary | ICD-10-CM | POA: Diagnosis not present

## 2023-12-10 DIAGNOSIS — N3000 Acute cystitis without hematuria: Secondary | ICD-10-CM | POA: Insufficient documentation

## 2023-12-10 DIAGNOSIS — R109 Unspecified abdominal pain: Secondary | ICD-10-CM | POA: Diagnosis not present

## 2023-12-10 LAB — URINALYSIS, ROUTINE W REFLEX MICROSCOPIC
Bacteria, UA: NONE SEEN
Bilirubin Urine: NEGATIVE
Glucose, UA: NEGATIVE mg/dL
Hgb urine dipstick: NEGATIVE
Nitrite: POSITIVE — AB
Protein, ur: NEGATIVE mg/dL
Specific Gravity, Urine: 1.032 — ABNORMAL HIGH (ref 1.005–1.030)
pH: 7.5 (ref 5.0–8.0)

## 2023-12-10 LAB — CBC
HCT: 39.2 % (ref 36.0–46.0)
Hemoglobin: 13.4 g/dL (ref 12.0–15.0)
MCH: 32.1 pg (ref 26.0–34.0)
MCHC: 34.2 g/dL (ref 30.0–36.0)
MCV: 94 fL (ref 80.0–100.0)
Platelets: 173 K/uL (ref 150–400)
RBC: 4.17 MIL/uL (ref 3.87–5.11)
RDW: 12.8 % (ref 11.5–15.5)
WBC: 5.1 K/uL (ref 4.0–10.5)
nRBC: 0 % (ref 0.0–0.2)

## 2023-12-10 LAB — COMPREHENSIVE METABOLIC PANEL WITH GFR
ALT: 15 U/L (ref 0–44)
AST: 21 U/L (ref 15–41)
Albumin: 4.4 g/dL (ref 3.5–5.0)
Alkaline Phosphatase: 46 U/L (ref 38–126)
Anion gap: 12 (ref 5–15)
BUN: 18 mg/dL (ref 8–23)
CO2: 27 mmol/L (ref 22–32)
Calcium: 9.8 mg/dL (ref 8.9–10.3)
Chloride: 103 mmol/L (ref 98–111)
Creatinine, Ser: 0.8 mg/dL (ref 0.44–1.00)
GFR, Estimated: >60 mL/min (ref >60)
Glucose, Bld: 99 mg/dL (ref 70–99)
Potassium: 3.8 mmol/L (ref 3.5–5.1)
Sodium: 141 mmol/L (ref 135–145)
Total Bilirubin: 0.5 mg/dL (ref 0.0–1.2)
Total Protein: 6.8 g/dL (ref 6.5–8.1)

## 2023-12-10 LAB — LIPASE, BLOOD: Lipase: 22 U/L (ref 11–51)

## 2023-12-10 MED ORDER — NITROFURANTOIN MONOHYD MACRO 100 MG PO CAPS
100.0000 mg | ORAL_CAPSULE | Freq: Once | ORAL | Status: AC
  Administered 2023-12-10: 100 mg via ORAL
  Filled 2023-12-10: qty 1

## 2023-12-10 MED ORDER — LACTULOSE 10 GM/15ML PO SOLN
10.0000 g | Freq: Two times a day (BID) | ORAL | 0 refills | Status: AC | PRN
Start: 2023-12-10 — End: ?

## 2023-12-10 MED ORDER — IOHEXOL 300 MG/ML  SOLN
80.0000 mL | Freq: Once | INTRAMUSCULAR | Status: AC | PRN
  Administered 2023-12-10: 80 mL via INTRAVENOUS

## 2023-12-10 MED ORDER — NITROFURANTOIN MONOHYD MACRO 100 MG PO CAPS: 100.0000 mg | ORAL_CAPSULE | Freq: Two times a day (BID) | ORAL | 0 refills | Status: AC

## 2023-12-10 MED ORDER — LACTULOSE 10 GM/15ML PO SOLN
10.0000 g | Freq: Once | ORAL | Status: AC
  Administered 2023-12-10: 10 g via ORAL
  Filled 2023-12-10: qty 30

## 2023-12-10 NOTE — ED Provider Notes (Signed)
 [facility] Provider Note   CSN: [id number] Arrival date & time: 12/10/23  1642     Patient presents with: Constipation   Melanie Golden is a 73 y.o. female here for evaluation of constipation left-sided abdominal pain.  This has been an ongoing issue for patient.  She follows with Dr. Dianna with GI.  She has been on Linzess however states this has not worked.  She has recently transition to medicine 4 days ago however this is not helped either.  She is not doing any enemas at home.  She feels like she feels a bulge in her left abdomen.  She denies any bloody stool.  She is passing flatus.  Had a small bowel movement yesterday which was nuggets.  No fever, nausea, vomiting, back pain, dysuria, hematuria.  Has tried taken Dulcolax, MiraLAX , Linzess without relief   HPI     Prior to Admission medications   Medication Sig Start Date End Date Taking? Authorizing Provider  lactulose  (CHRONULAC ) 10 GM/15ML solution Take 15 mLs (10 g total) by mouth 2 (two) times daily as needed for moderate constipation or severe constipation. 12/10/23  Yes Yitzchok Carriger A, PA-C  nitrofurantoin , macrocrystal-monohydrate, (MACROBID ) 100 MG capsule Take 1 capsule (100 mg total) by mouth 2 (two) times daily. 12/10/23  Yes Chrisie Jankovich A, PA-C  baclofen  (LIORESAL ) 10 MG tablet Take 1 tablet (10 mg total) by mouth 3 (three) times daily. As needed for muscle spasm 08/22/19   Brown, Blaine K, PA-C  Biotin  5000 MCG CAPS Take 5,000 mcg by mouth daily.    [provider]  carboxymethylcellulose (REFRESH PLUS) 0.5 % SOLN Place 1 drop into both eyes 3 (three) times daily as needed (Dry eyes).    [provider]  Cholecalciferol  (VITAMIN D ) 2000 UNITS tablet Take 2,000 Units by mouth daily.      [provider]  docusate sodium  (COLACE) 100 MG capsule Take 100-200 mg by mouth in the morning and at bedtime. Takes two in the morning and one in the  evening.    [provider]  HYDROcodone -acetaminophen  (NORCO) 10-325 MG tablet Take 1 tablet by mouth every 6 (six) hours as needed. 08/22/19   Brown, Blaine K, PA-C  ketotifen  (ZADITOR ) 0.025 % ophthalmic solution Place 1 drop into both eyes daily as needed (Allergies).    [provider]  Magnesium  500 MG CAPS Take 500 mg by mouth daily.    [provider]  Multiple Vitamins-Minerals (HAIR SKIN AND NAILS FORMULA PO) Take 1 tablet by mouth daily.    [provider]  ondansetron  (ZOFRAN ) 4 MG tablet Take 1 tablet (4 mg total) by mouth every 8 (eight) hours as needed for nausea or vomiting. 08/22/19   Brown, Blaine K, PA-C  OVER THE COUNTER MEDICATION Take 1 capsule by mouth daily. Thera tears eye nutrition    [provider]  OVER THE COUNTER MEDICATION Take 1 capsule by mouth in the morning and at bedtime. Viviscal for thinning hair.    [provider]  Probiotic Product (PROBIOTIC FORMULA PO) Take 1 tablet by mouth daily.      [provider]  Salicylic Acid  (GOLD BOND PSORIASIS RELIEF) 3 % CREA Apply 1 application topically daily as needed (Dry/irritated skin).    [provider]  sennosides-docusate sodium  (SENOKOT-S) 8.6-50 MG tablet Take 2 tablets by mouth daily. 08/22/19   Brown, Blaine K, PA-C  vitamin B-12 (CYANOCOBALAMIN ) 1000 MCG tablet Take 1,000 mcg  by mouth daily.    [provider]    Allergies: Sudafed [pseudoephedrine hcl], Ampicillin, Sulfa antibiotics, and Sulfamethoxazole-trimethoprim    Review of Systems  Constitutional: Negative.   HENT: Negative.    Respiratory: Negative.    Cardiovascular: Negative.   Gastrointestinal:  Positive for abdominal pain and constipation. Negative for blood in stool, diarrhea, nausea, rectal pain and vomiting.  Genitourinary: Negative.   Musculoskeletal: Negative.   Skin: Negative.   Neurological: Negative.   All other systems reviewed and are  negative.   Updated Vital Signs BP (!) 165/98   Pulse 73   Temp 98 F (36.7 C) (Oral)   Resp 18   Ht 5' 1 (1.549 m)   Wt 55.8 kg   SpO2 95%   BMI 23.24 kg/m   Physical Exam Vitals and nursing note reviewed.  Constitutional:      General: She is not in acute distress.    Appearance: She is well-developed. She is not ill-appearing, toxic-appearing or diaphoretic.  HENT:     Head: Atraumatic.     Nose: Nose normal.     Mouth/Throat:     Mouth: Mucous membranes are moist.  Eyes:     Pupils: Pupils are equal, round, and reactive to light.  Cardiovascular:     Rate and Rhythm: Normal rate.     Pulses: Normal pulses.     Heart sounds: Normal heart sounds.  Pulmonary:     Effort: Pulmonary effort is normal. No respiratory distress.     Breath sounds: Normal breath sounds.  Abdominal:     General: Bowel sounds are normal. There is no distension.     Palpations: Abdomen is soft.     Tenderness: There is abdominal tenderness. There is no right CVA tenderness, left CVA tenderness, guarding or rebound.  Musculoskeletal:        General: Normal range of motion.     Cervical back: Normal range of motion.  Skin:    General: Skin is warm and dry.     Capillary Refill: Capillary refill takes less than 2 seconds.  Neurological:     General: No focal deficit present.     Mental Status: She is alert.  Psychiatric:        Mood and Affect: Mood normal.     (all labs ordered are listed, but only abnormal results are displayed) Labs Reviewed  URINALYSIS, ROUTINE W REFLEX MICROSCOPIC - Abnormal; Notable for the following components:      Result Value   Specific Gravity, Urine 1.032 (*)    Ketones, ur TRACE (*)    Nitrite POSITIVE (*)    Leukocytes,Ua SMALL (*)    All other components within normal limits  URINE CULTURE  LIPASE, BLOOD  COMPREHENSIVE METABOLIC PANEL WITH GFR  CBC    EKG: None  Radiology: CT ABDOMEN PELVIS W CONTRAST Result Date: 12/10/2023 CLINICAL DATA:   Left-sided abdominal and lower quadrant pain. Constipation. EXAM: CT ABDOMEN AND PELVIS WITH CONTRAST TECHNIQUE: Multidetector CT imaging of the abdomen and pelvis was performed using the standard protocol following bolus administration of intravenous contrast. RADIATION DOSE REDUCTION: This exam was performed according to the departmental dose-optimization program which includes automated exposure control, adjustment of the mA and/or kV according to patient size and/or use of iterative reconstruction technique. CONTRAST:  80mL OMNIPAQUE  IOHEXOL  300 MG/ML  SOLN COMPARISON:  10/14/2022 FINDINGS: Lower chest: Beam hardening artifact from lumbar spine hardware causes mild degradation. Clear lung bases. Normal heart size without pericardial or  pleural effusion. Left circumflex coronary artery calcification. Hepatobiliary: Hepatomegaly versus a Riedel's lobe at 20 cm. Normal gallbladder, without biliary ductal dilatation. Pancreas: Pancreas divisum or variant againa identified with prominence of the dorsal duct in the pancreatic head, slightly more distinct than on the prior. No upstream duct dilatation or acute inflammation. Spleen: Normal in size, without focal abnormality. Adrenals/Urinary Tract: Normal adrenal glands. Normal kidneys, without hydronephrosis. Normal urinary bladder. Stomach/Bowel: Normal stomach, without wall thickening. Scattered colonic diverticula. Colonic stool burden suggests constipation. Appendix is likely identified on image 65/3, normal. Normal small bowel. Vascular/Lymphatic: Aortic atherosclerosis. Circumaortic left renal vein. No abdominopelvic adenopathy. Reproductive: Normal uterus and adnexa. Other: No significant free fluid.  No free intraperitoneal air. Musculoskeletal: L4-S1 trans pedicle screw fixation, presuming a transitional S1 vertebral body. Mild S shaped thoracolumbar spine curvature. IMPRESSION: 1. Possible constipation. No other explanation for patient's symptoms. 2. Pancreas  divisum or variant. 3.  Aortic Atherosclerosis (ICD10-I70.0). Electronically Signed   By: Rockey Kilts M.D.   On: 12/10/2023 19:51     Procedures   Medications Ordered in the ED  iohexol  (OMNIPAQUE ) 300 MG/ML solution 80 mL (80 mLs Intravenous Contrast Given 12/10/23 1924)  lactulose  (CHRONULAC ) 10 GM/15ML solution 10 g (10 g Oral Given 12/10/23 2114)  nitrofurantoin  (macrocrystal-monohydrate) (MACROBID ) capsule 100 mg (100 mg Oral Given 12/10/23 8155)   73 year old here for evaluation of constipation and abdominal pain.  Constipation is an ongoing issue for patient however recently she developed some left-sided abdominal pain feels like she can palpate a mass in her abdomen.  Last bowel movement yesterday which she describes as small nuggets.  No bloody stool.  She is passing flatus.  No fever, nausea or vomiting.  No urinary symptoms.  Will plan on labs, imaging.  Labs and imaging personally viewed and interpreted:  CBC without leukocytosis Metabolic panel without significant abnormal Lipase 20 UA positive nitrate, small leuks, 11-20 WBC--sent for culture.  Patient does have some abdominal pain however no dysuria hematuria.  Shared decision making.  Will treat for UTI in the meantime  CT abdomen pelvis with possible constipation  Discussed results with patient, family in room.  Will do enema, lactulose .  Patient reassessed.  Multiple large bowel movements here in the emergency department.  Will have her do lactulose  at home and close follow-up with GI.   Patient is nontoxic, nonseptic appearing, in no apparent distress.  Patient's pain and other symptoms adequately managed in emergency department.  Fluid bolus given.  Labs, imaging and vitals reviewed.  Patient does not meet the SIRS or Sepsis criteria.  On repeat exam patient does not have a surgical abdomin and there are no peritoneal signs.  No indication of appendicitis, bowel obstruction, bowel perforation, cholecystitis,  diverticulitis, PID, AAA, dissection, traumatic injury, pyelonephritis, kidney stone, stercoral colitis, GI bleed.  Patient discharged home with symptomatic treatment and given strict instructions for follow-up with their primary care physician.  I have also discussed reasons to return immediately to the ER.  Patient expresses understanding and agrees with plan.                                   Medical Decision Making Amount and/or Complexity of Data Reviewed Independent Historian: spouse    Details: Spouse, family in room External Data Reviewed: labs, radiology, ECG and notes. Labs: ordered. Decision-making details documented in ED Course. Radiology: ordered and independent interpretation performed. Decision-making details documented in  ED Course.  Risk OTC drugs. Prescription drug management. Decision regarding hospitalization. Diagnosis or treatment significantly limited by social determinants of health.        Final diagnoses:  Constipation, unspecified constipation type  Acute cystitis without hematuria    ED Discharge Orders          Ordered    nitrofurantoin , macrocrystal-monohydrate, (MACROBID ) 100 MG capsule  2 times daily        12/10/23 2140    lactulose  (CHRONULAC ) 10 GM/15ML solution  2 times daily PRN        12/10/23 2140               Zuzu Befort A, PA-C 12/10/23 2222    Dasie Faden, MD 12/13/23 [phone]

## 2023-12-10 NOTE — ED Triage Notes (Signed)
 Pt caox4 c/o constipation x1 yr, reporting she has been taking OTC meds at home without relief. Pt c/o LUQ abd pain, nausea. LBM within the past few days.

## 2023-12-10 NOTE — Discharge Instructions (Signed)
 It was a pleasure taking care of you here today  We have started you on a few medications to help with your symptoms  Macrobid -this is a medication that can help with your UTI Lactulose -this is a medication for constipation.  Make sure to follow-up with your GI doctor  Return for any worsening symptoms

## 2023-12-13 LAB — URINE CULTURE: Culture: >=100000 — AB

## 2023-12-14 ENCOUNTER — Telehealth (HOSPITAL_BASED_OUTPATIENT_CLINIC_OR_DEPARTMENT_OTHER): Payer: Self-pay

## 2023-12-14 NOTE — Telephone Encounter (Signed)
 Post ED Visit - Positive Culture Follow-up  Culture report reviewed by antimicrobial stewardship pharmacist: [facility] [x]  Mendel Barter, Pharm.D. []  Venetia Gully, Pharm.D., BCPS AQ-ID []  Garrel Crews, Pharm.D., BCPS []  Almarie Lunger, Pharm.D., BCPS []  [place], [place].D., BCPS, AAHIVP []  Rosaline Bihari, Pharm.D., BCPS, AAHIVP []  Vernell Meier, PharmD, BCPS []  Latanya Hint, PharmD, BCPS []  Donald Medley, PharmD, BCPS []  Rocky Bold, PharmD []  Dorothyann Alert, PharmD, BCPS []  Morene Babe, PharmD  [facility] []  Rosaline Edison, PharmD []  Romona Bliss, PharmD []  Dolphus Roller, PharmD []  Veva Seip, Rph []  Vernell Daunt) Leonce, PharmD []  Eva Allis, PharmD []  Rosaline Millet, PharmD []  Iantha Batch, PharmD []  Arvin Gauss, PharmD []  Wanda Hasting, PharmD []  Ronal Rav, PharmD []  Rocky Slade, PharmD []  Bard Jeans, PharmD   Positive urine culture Treated with Nitrofurantoin , organism sensitive to the same and no further patient follow-up is required at this time.  Ruth Camelia Elbe 12/14/2023, 9:55 AM

## 2023-12-15 NOTE — Progress Notes (Addendum)
 COVID Vaccine Completed:  Date of COVID positive in last 90 days:  PCP - Clemencia Hint, MD Cardiologist -  Gatroenterologist- Dr Dianna  Medical clearance in media tab dated 10/08/23 Gastro clearance in media tab dated 10/15/23  Chest x-ray - n/a EKG - n/a Stress Test - n/a ECHO -  n/a Cardiac Cath - n/a Pacemaker/ICD device last checked: n/a Spinal Cord Stimulator: n/a  Bowel Prep - no  Sleep Study - n/a CPAP -   Fasting Blood Sugar - n/a Checks Blood Sugar _____ times a day  Last dose of GLP1 agonist-  N/A GLP1 instructions:  Do not take after     Last dose of SGLT-2 inhibitors-  N/A SGLT-2 instructions:  Do not take after     Blood Thinner Instructions:  Last dose: n/a  Time: Aspirin  Instructions: Last Dose:  Activity level: Can go up a flight of stairs and perform activities of daily living without stopping and without symptoms of chest pain or shortness of breath.   Anesthesia review:   Patient denies shortness of breath, fever, cough and chest pain at PAT appointment  Patient verbalized understanding of instructions that were given to them at the PAT appointment. Patient was also instructed that they will need to review over the PAT instructions again at home before surgery.

## 2023-12-15 NOTE — Patient Instructions (Addendum)
 SURGICAL WAITING ROOM VISITATION  Patients having surgery or a procedure may have no more than 2 support people in the waiting area - these visitors may rotate.    Children under the age of 42 must have an adult with them who is not the patient.  Visitors with respiratory illnesses are discouraged from visiting and should remain at home.  If the patient needs to stay at the hospital during part of their recovery, the visitor guidelines for inpatient rooms apply. Pre-op nurse will coordinate an appropriate time for 1 support person to accompany patient in pre-op.  This support person may not rotate.    Please refer to the [facility] website for the visitor guidelines for Inpatients (after your surgery is over and you are in a regular room).    Your procedure is scheduled on: 12/29/23   Report to [facility] Main Entrance    Report to admitting at 6:00 AM   Call this number if you have problems the morning of surgery [phone]   Do not eat food  or drink liquids:After Midnight.          If you have questions, please contact your surgeon's office.   FOLLOW BOWEL PREP AND ANY ADDITIONAL PRE OP INSTRUCTIONS YOU RECEIVED FROM YOUR SURGEON'S OFFICE!!!     Oral Hygiene is also important to reduce your risk of infection.                                    Remember - BRUSH YOUR TEETH THE MORNING OF SURGERY WITH YOUR REGULAR TOOTHPASTE  DENTURES WILL BE REMOVED PRIOR TO SURGERY PLEASE DO NOT APPLY Poly grip OR ADHESIVES!!!   Stop all vitamins and herbal supplements 7 days before surgery.   Take these medicines the morning of surgery with A SIP OF WATER : None                              You may not have any metal on your body including hair pins, jewelry, and body piercing             Do not wear make-up, lotions, powders, perfumes, or deodorant  Do not wear nail polish including gel and S&S, artificial/acrylic nails, or any other type of covering on natural nails  including finger and toenails. If you have artificial nails, gel coating, etc. that needs to be removed by a nail salon please have this removed prior to surgery or surgery may need to be canceled/ delayed if the surgeon/ anesthesia feels like they are unable to be safely monitored.   Do not shave  48 hours prior to surgery.    Do not bring valuables to the hospital. [facility] IS NOT             RESPONSIBLE   FOR VALUABLES.   Contacts, glasses, dentures or bridgework may not be worn into surgery.   Bring small overnight bag day of surgery.   DO NOT BRING YOUR HOME MEDICATIONS TO THE HOSPITAL. PHARMACY WILL DISPENSE MEDICATIONS LISTED ON YOUR MEDICATION LIST TO YOU DURING YOUR ADMISSION IN THE HOSPITAL!              Please read over the following fact sheets you were given: IF YOU HAVE QUESTIONS ABOUT YOUR PRE-OP INSTRUCTIONS PLEASE CALL [phone].   If you received a COVID test during  your pre-op visit  it is requested that you wear a mask when out in public, stay away from anyone that may not be feeling well and notify your surgeon if you develop symptoms. If you test positive for Covid or have been in contact with anyone that has tested positive in the last 10 days please notify you surgeon.      Pre-operative 5 CHG Bath Instructions   You can play a key role in reducing the risk of infection after surgery. Your skin needs to be as free of germs as possible. You can reduce the number of germs on your skin by washing with CHG (chlorhexidine  gluconate) soap before surgery. CHG is an antiseptic soap that kills germs and continues to kill germs even after washing.   DO NOT use if you have an allergy to chlorhexidine /CHG or antibacterial soaps. If your skin becomes reddened or irritated, stop using the CHG and notify one of our RNs at [phone].   Please shower with the CHG soap starting 4 days before surgery using the following schedule:     Please keep in mind the  following:  DO NOT shave, including legs and underarms, starting the day of your first shower.   You may shave your face at any point before/day of surgery.  Place clean sheets on your bed the day you start using CHG soap. Use a clean washcloth (not used since being washed) for each shower. DO NOT sleep with pets once you start using the CHG.   CHG Shower Instructions:  If you choose to wash your hair and private area, wash first with your normal shampoo/soap.  After you use shampoo/soap, rinse your hair and body thoroughly to remove shampoo/soap residue.  Turn the water  OFF and apply about 3 tablespoons (45 ml) of CHG soap to a CLEAN washcloth.  Apply CHG soap ONLY FROM YOUR NECK DOWN TO YOUR TOES (washing for 3-5 minutes)  DO NOT use CHG soap on face, private areas, open wounds, or sores.  Pay special attention to the area where your surgery is being performed.  If you are having back surgery, having someone wash your back for you may be helpful. Wait 2 minutes after CHG soap is applied, then you may rinse off the CHG soap.  Pat dry with a clean towel  Put on clean clothes/pajamas   If you choose to wear lotion, please use ONLY the CHG-compatible lotions on the back of this paper.     Additional instructions for the day of surgery: DO NOT APPLY any lotions, deodorants, cologne, or perfumes.   Put on clean/comfortable clothes.  Brush your teeth.  Ask your nurse before applying any prescription medications to the skin.      CHG Compatible Lotions   Aveeno Moisturizing lotion  Cetaphil Moisturizing Cream  Cetaphil Moisturizing Lotion  Clairol Herbal Essence Moisturizing Lotion, Dry Skin  Clairol Herbal Essence Moisturizing Lotion, Extra Dry Skin  Clairol Herbal Essence Moisturizing Lotion, Normal Skin  Curel Age Defying Therapeutic Moisturizing Lotion with Alpha Hydroxy  Curel Extreme Care Body Lotion  Curel Soothing Hands Moisturizing Hand Lotion  Curel Therapeutic  Moisturizing Cream, Fragrance-Free  Curel Therapeutic Moisturizing Lotion, Fragrance-Free  Curel Therapeutic Moisturizing Lotion, Original Formula  Eucerin Daily Replenishing Lotion  Eucerin Dry Skin Therapy Plus Alpha Hydroxy Crme  Eucerin Dry Skin Therapy Plus Alpha Hydroxy Lotion  Eucerin Original Crme  Eucerin Original Lotion  Eucerin Plus Crme Eucerin Plus Lotion  Eucerin TriLipid Replenishing Lotion  Keri Anti-Bacterial  Hand Lotion  Keri Deep Conditioning Original Lotion Dry Skin Formula Softly Scented  Keri Deep Conditioning Original Lotion, Fragrance Free Sensitive Skin Formula  Keri Lotion Fast Absorbing Fragrance Free Sensitive Skin Formula  Keri Lotion Fast Absorbing Softly Scented Dry Skin Formula  Keri Original Lotion  Keri Skin Renewal Lotion Keri Silky Smooth Lotion  Keri Silky Smooth Sensitive Skin Lotion  Nivea Body Creamy Conditioning Oil  Nivea Body Extra Enriched Lotion  Nivea Body Original Lotion  Nivea Body Sheer Moisturizing Lotion Nivea Crme  Nivea Skin Firming Lotion  NutraDerm 30 Skin Lotion  NutraDerm Skin Lotion  NutraDerm Therapeutic Skin Cream  NutraDerm Therapeutic Skin Lotion  ProShield Protective Hand Cream  Provon moisturizing lotion   [place]- Preparing for Total Shoulder Arthroplasty    Before surgery, you can play an important role. Because skin is not sterile, your skin needs to be as free of germs as possible. You can reduce the number of germs on your skin by using the following products. Benzoyl Peroxide Gel Reduces the number of germs present on the skin Applied twice a day to shoulder area starting two days before surgery    ==================================================================  Please follow these instructions carefully:  BENZOYL PEROXIDE 5% GEL  Please do not use if you have an allergy to benzoyl peroxide.   If your skin becomes reddened/irritated stop using the benzoyl peroxide.  Starting two days before  surgery, apply as follows: Apply benzoyl peroxide in the morning and at night. Apply after taking a shower. If you are not taking a shower clean entire shoulder front, back, and side along with the armpit with a clean wet washcloth.  Place a quarter-sized dollop on your shoulder and rub in thoroughly, making sure to cover the front, back, and side of your shoulder, along with the armpit.   2 days before ____ AM   ____ PM              1 day before ____ AM   ____ PM                         Do this twice a day for two days.  (Last application is the night before surgery, AFTER using the CHG soap as described below).  Do NOT apply benzoyl peroxide gel on the day of surgery. Incentive Spirometer  An incentive spirometer is a tool that can help keep your lungs clear and active. This tool measures how well you are filling your lungs with each breath. Taking long deep breaths may help reverse or decrease the chance of developing breathing (pulmonary) problems (especially infection) following: A long period of time when you are unable to move or be active. BEFORE THE PROCEDURE  If the spirometer includes an indicator to show your best effort, your nurse or respiratory therapist will set it to a desired goal. If possible, sit up straight or lean slightly forward. Try not to slouch. Hold the incentive spirometer in an upright position. INSTRUCTIONS FOR USE  Sit on the edge of your bed if possible, or sit up as far as you can in bed or on a chair. Hold the incentive spirometer in an upright position. Breathe out normally. Place the mouthpiece in your mouth and seal your lips tightly around it. Breathe in slowly and as deeply as possible, raising the piston or the ball toward the top of the column. Hold your breath for 3-5 seconds or for as long as possible.  Allow the piston or ball to fall to the bottom of the column. Remove the mouthpiece from your mouth and breathe out normally. Rest for a few  seconds and repeat Steps 1 through 7 at least 10 times every 1-2 hours when you are awake. Take your time and take a few normal breaths between deep breaths. The spirometer may include an indicator to show your best effort. Use the indicator as a goal to work toward during each repetition. After each set of 10 deep breaths, practice coughing to be sure your lungs are clear. If you have an incision (the cut made at the time of surgery), support your incision when coughing by placing a pillow or rolled up towels firmly against it. Once you are able to get out of bed, walk around indoors and cough well. You may stop using the incentive spirometer when instructed by your caregiver.  RISKS AND COMPLICATIONS Take your time so you do not get dizzy or light-headed. If you are in pain, you may need to take or ask for pain medication before doing incentive spirometry. It is harder to take a deep breath if you are having pain. AFTER USE Rest and breathe slowly and easily. It can be helpful to keep track of a log of your progress. Your caregiver can provide you with a simple table to help with this. If you are using the spirometer at home, follow these instructions: SEEK MEDICAL CARE IF:  You are having difficultly using the spirometer. You have trouble using the spirometer as often as instructed. Your pain medication is not giving enough relief while using the spirometer. You develop fever of 100.5 F (38.1 C) or higher. SEEK IMMEDIATE MEDICAL CARE IF:  You cough up bloody sputum that had not been present before. You develop fever of 102 F (38.9 C) or greater. You develop worsening pain at or near the incision site. MAKE SURE YOU:  Understand these instructions. Will watch your condition. Will get help right away if you are not doing well or get worse. Document Released: 08/31/2006 Document Revised: 07/13/2011 Document Reviewed: 11/01/2006 Exit[employer] Patient Information 2014 [employer],  [place].   ________________________________________________________________________ WHAT IS A BLOOD TRANSFUSION? Blood Transfusion Information  A transfusion is the replacement of blood or some of its parts. Blood is made up of multiple cells which provide different functions. Red blood cells carry oxygen and are used for blood loss replacement. White blood cells fight against infection. Platelets control bleeding. Plasma helps clot blood. Other blood products are available for specialized needs, such as hemophilia or other clotting disorders. BEFORE THE TRANSFUSION  Who gives blood for transfusions?  Healthy volunteers who are fully evaluated to make sure their blood is safe. This is blood bank blood. Transfusion therapy is the safest it has ever been in the practice of medicine. Before blood is taken from a donor, a complete history is taken to make sure that person has no history of diseases nor engages in risky social behavior (examples are intravenous drug use or sexual activity with multiple partners). The donor's travel history is screened to minimize risk of transmitting infections, such as malaria. The donated blood is tested for signs of infectious diseases, such as HIV and hepatitis. The blood is then tested to be sure it is compatible with you in order to minimize the chance of a transfusion reaction. If you or a relative donates blood, this is often done in anticipation of surgery and is not appropriate for emergency situations. It takes  many days to process the donated blood. RISKS AND COMPLICATIONS Although transfusion therapy is very safe and saves many lives, the main dangers of transfusion include:  Getting an infectious disease. Developing a transfusion reaction. This is an allergic reaction to something in the blood you were given. Every precaution is taken to prevent this. The decision to have a blood transfusion has been considered carefully by your caregiver before blood is  given. Blood is not given unless the benefits outweigh the risks. AFTER THE TRANSFUSION Right after receiving a blood transfusion, you will usually feel much better and more energetic. This is especially true if your red blood cells have gotten low (anemic). The transfusion raises the level of the red blood cells which carry oxygen, and this usually causes an energy increase. The nurse administering the transfusion will monitor you carefully for complications. HOME CARE INSTRUCTIONS  No special instructions are needed after a transfusion. You may find your energy is better. Speak with your caregiver about any limitations on activity for underlying diseases you may have. SEEK MEDICAL CARE IF:  Your condition is not improving after your transfusion. You develop redness or irritation at the intravenous (IV) site. SEEK IMMEDIATE MEDICAL CARE IF:  Any of the following symptoms occur over the next 12 hours: Shaking chills. You have a temperature by mouth above 102 F (38.9 C), not controlled by medicine. Chest, back, or muscle pain. People around you feel you are not acting correctly or are confused. Shortness of breath or difficulty breathing. Dizziness and fainting. You get a rash or develop hives. You have a decrease in urine output. Your urine turns a dark color or changes to pink, red, or brown. Any of the following symptoms occur over the next 10 days: You have a temperature by mouth above 102 F (38.9 C), not controlled by medicine. Shortness of breath. Weakness after normal activity. The white part of the eye turns yellow (jaundice). You have a decrease in the amount of urine or are urinating less often. Your urine turns a dark color or changes to pink, red, or brown. Document Released: 04/17/2000 Document Revised: 07/13/2011 Document Reviewed: 12/05/2007 [facility] Patient Information 2014 [place], [place].  _______________________________________________________________________

## 2023-12-16 ENCOUNTER — Encounter (HOSPITAL_COMMUNITY): Payer: Self-pay

## 2023-12-16 ENCOUNTER — Other Ambulatory Visit: Payer: Self-pay

## 2023-12-16 ENCOUNTER — Encounter (HOSPITAL_COMMUNITY)
Admission: RE | Admit: 2023-12-16 | Discharge: 2023-12-16 | Disposition: A | Discharge status: Home / Self Care | Source: Ambulatory Visit | Attending: Orthopaedic Surgery | Admitting: Orthopaedic Surgery

## 2023-12-16 VITALS — BP 147/86 | HR 82 | Temp 98.4°F | Resp 18 | Ht 60.0 in | Wt 118.0 lb

## 2023-12-16 DIAGNOSIS — Z01812 Encounter for preprocedural laboratory examination: Secondary | ICD-10-CM | POA: Diagnosis not present

## 2023-12-16 DIAGNOSIS — Z01818 Encounter for other preprocedural examination: Secondary | ICD-10-CM

## 2023-12-16 HISTORY — DX: Constipation, unspecified: K59.00

## 2023-12-16 LAB — TYPE AND SCREEN
ABO/RH(D): A POS
Antibody Screen: NEGATIVE

## 2023-12-16 LAB — SURGICAL PCR SCREEN
MRSA, PCR: NEGATIVE
Staphylococcus aureus: NEGATIVE

## 2023-12-17 DIAGNOSIS — R140 Abdominal distension (gaseous): Secondary | ICD-10-CM | POA: Diagnosis not present

## 2023-12-17 DIAGNOSIS — K5904 Chronic idiopathic constipation: Secondary | ICD-10-CM | POA: Diagnosis not present

## 2023-12-28 NOTE — H&P (Signed)
 PREOPERATIVE H&P  Chief Complaint: Complications due to internal orthopedic device, implant, and graft  HPI: Melanie Golden is a 73 y.o. female who is scheduled for Procedure(s): REVISION, REVERSE TOTAL ARTHROPLASTY, SHOULDER.   Patient has a past medical history significant for PONV.   The patient had total shoulder arthroplasty three years ago.  She has had sense of instability in the joint.  She is frustrated by the shoulder.    Symptoms are rated as moderate to severe, and have been worsening.  This is significantly impairing activities of daily living.    Please see clinic note for further details on this patient's care.    She has elected for surgical management.   Past Medical History:  Diagnosis Date   Arthritis    knee,shoulder,cervical,hand   Complication of anesthesia    Constipation    Fibromyalgia 1989   PONV (postoperative nausea and vomiting)    Past Surgical History:  Procedure Laterality Date   BACK SURGERY  2013   with fusion   SINUS IRRIGATION  1978   TOTAL SHOULDER ARTHROPLASTY Left 08/22/2019   Procedure: TOTAL SHOULDER ARTHROPLASTY;  Surgeon: Josefina Chew, MD;  Location: [facility];  Service: Orthopedics;  Laterality: Left;   Social History   Socioeconomic History   Marital status: Married    Spouse name: Not on file   Number of children: Not on file   Years of education: Not on file   Highest education level: Not on file  Occupational History   Not on file  Tobacco Use   Smoking status: Never   Smokeless tobacco: Never  Vaping Use   Vaping status: Never Used  Substance and Sexual Activity   Alcohol  use: Never   Drug use: Never   Sexual activity: Not on file  Other Topics Concern   Not on file  Social History Narrative   Not on file   Social Drivers of Health   Financial Resource Strain: Not on file  Food Insecurity: Not on file  Transportation Needs: Not on file  Physical Activity: Not on file  Stress: Not on file  Social  Connections: Not on file   No family history on file. Allergies  Allergen Reactions   Sudafed [Pseudoephedrine Hcl]     Increased BP   Ampicillin Rash   Sulfa Antibiotics Rash   Sulfamethoxazole-Trimethoprim Rash   Prior to Admission medications   Medication Sig Start Date End Date Taking? Authorizing Provider  baclofen  (LIORESAL ) 10 MG tablet Take 1 tablet (10 mg total) by mouth 3 (three) times daily. As needed for muscle spasm Patient taking differently: Take 5 mg by mouth 3 (three) times daily as needed for muscle spasms (cervical arthritis). 08/22/19  Yes Brown, Blaine K, PA-C  Biotin  5000 MCG CAPS Take 5,000 mcg by mouth every evening. Gummy   Yes [provider]  Calcium-Magnesium  (CAL-MAG PO) Take 1 tablet by mouth every other day. In the morning.   Yes [provider]  Cholecalciferol  (VITAMIN D3) 50 MCG (2000 UT) TABS Take 2,000 Units by mouth every evening.   Yes [provider]  DHA-EPA-Flaxseed Oil-Vitamin E (THERATEARS EYE NUTRITION PO) Take 1 capsule by mouth in the morning and at bedtime.   Yes [provider]  DHA-EPA-Vitamin E (OMEGA-3 COMPLEX PO) Take 1-2 capsules by mouth See admin instructions. OMEGAXL [facility] [employer] supplement Take 1 capsule by mouth in the morning & take 2 capsules by mouth in the evening.   Yes [provider]  docusate sodium  (COLACE) 100 MG capsule Take 100-200 mg by mouth See admin instructions. Takes two in the morning and one in the evening.   Yes [provider]  Eyelid Cleansers (OCUSOFT BABY EYELID & EYELASH EX) Apply 1 Application topically 2 (two) times daily.   Yes [provider]  furosemide (LASIX) 20 MG tablet Take 20 mg by mouth daily as needed (fluid retention.). 09/15/23  Yes [provider]  lactulose  (CHRONULAC ) 10 GM/15ML solution Take 15 mLs (10 g total) by mouth 2 (two) times daily as needed for moderate constipation or severe constipation. Patient taking  differently: Take 10 g by mouth in the morning and at bedtime. 12/10/23  Yes Henderly, Britni A, PA-C  meloxicam  (MOBIC ) 15 MG tablet Take 15 mg by mouth daily as needed (pain.).   Yes [provider]  Misc Natural Products (TURMERIC, CURCUMIN, PO) Take 1 capsule by mouth at bedtime.   Yes [provider]  Multiple Vitamin (MULTIVITAMIN WITH MINERALS) TABS tablet Take 1 tablet by mouth in the morning.   Yes [provider]  Multiple Vitamins-Minerals (HAIR SKIN AND NAILS FORMULA PO) Take 1 tablet by mouth in the morning.   Yes [provider]  nitrofurantoin , macrocrystal-monohydrate, (MACROBID ) 100 MG capsule Take 1 capsule (100 mg total) by mouth 2 (two) times daily. 12/10/23  Yes Henderly, Britni A, PA-C  OVER THE COUNTER MEDICATION Take 1 capsule by mouth 2 (two) times a week. Viviscal for thinning hair.   Yes [provider]  Polyethyl Glycol-Propyl Glycol (SYSTANE ULTRA) 0.4-0.3 % SOLN Place 1 drop into both eyes 3 (three) times daily as needed (dry/irritated eyes).   Yes [provider]  saccharomyces boulardii (FLORASTOR) 250 MG capsule Take 250 mg by mouth 2 (two) times daily.   Yes [provider]  Salicylic Acid  (GOLD BOND PSORIASIS RELIEF) 3 % CREA Apply 1 application  topically daily.   Yes [provider]  Saw Palmetto 450 MG CAPS Take 450 mg by mouth in the morning and at bedtime.   Yes [provider]  triamcinolone  cream (KENALOG ) 0.1 % Apply 1 Application topically 2 (two) times daily as needed (facial irritation.).   Yes [provider]  vitamin B-12 (CYANOCOBALAMIN ) 1000 MCG tablet Take 1,000 mcg by mouth in the morning.   Yes [provider]  White Petrolatum-Mineral Oil (SYSTANE NIGHTTIME OP) Place 1 Application into both eyes at bedtime.   Yes [provider]    ROS: All other systems have been reviewed and were otherwise negative with the exception of those mentioned in the  HPI and as above.  Physical Exam: General: Alert, no acute distress Cardiovascular: No pedal edema Respiratory: No cyanosis, no use of accessory musculature GI: No organomegaly, abdomen is soft and non-tender Skin: No lesions in the area of chief complaint Neurologic: Sensation intact distally Psychiatric: Patient is competent for consent with normal mood and affect Lymphatic: No axillary or cervical lymphadenopathy  MUSCULOSKELETAL:  Range of motion of the shoulder is to about 30 degrees.  She has well healed incision.  She has weakness with all cuff testing.  Upon further evaluation it is clear that her shoulder is dislocating both anteriorly and posteriorly during range of motion testing.    Imaging: X-rays reviewed demonstrate concern for anterior cuff failure.  The implants look to be well fixed otherwise.  There is some erosion of the medial calcar, likely from proximal migration and there appears to be proximal migration of the humeral  head.   BMI: Estimated body mass index is 23.05 kg/m as calculated from the following:   Height as of 12/16/23: 5' (1.524 m).   Weight as of 12/16/23: 53.5 kg.  Lab Results  Component Value Date   ALBUMIN 4.4 12/10/2023   Diabetes: Patient does not have a diagnosis of diabetes.     Smoking Status:   reports that she has never smoked. She has never used smokeless tobacco.     Assessment: Complications due to internal orthopedic device, implant, and graft  Plan: Plan for Procedure(s): REVISION, REVERSE TOTAL ARTHROPLASTY, SHOULDER  The patient has clear instability and her shoulder dislocated posteriorly during her clinic exam today.  I was able to relocate it.  She would need to be converted to a reverse total shoulder arthroplasty.  I would like to keep her humeral component if possible, but we would have REVIVE implants available.  We will take cultures at the time of surgery and perform synovectomy.  She understands the plan of  care  The risks benefits and alternatives were discussed with the patient including but not limited to the risks of nonoperative treatment, versus surgical intervention including infection, bleeding, nerve injury,  blood clots, cardiopulmonary complications, morbidity, mortality, among others, and they were willing to proceed.   We additionally specifically discussed risks of axillary nerve injury, infection, periprosthetic fracture, continued pain and longevity of implants prior to beginning procedure.    Patient will be closely monitored in PACU for medical stabilization and pain control. If found stable in PACU, patient may be discharged home with outpatient follow-up. If any concerns regarding patient's stabilization patient will be admitted for observation after surgery. The patient is planning to be discharged home with outpatient PT.   The patient acknowledged the explanation, agreed to proceed with the plan and consent was signed.   She received operative clearance from: GI - Dr. Dianna - needs daily laxative to avoid constipation PCP - Dr. Dyane  Operative Plan: Left revision to reverse total shoulder arthroplasty   Discharge Medications: standard + laxative DVT Prophylaxis: aspirin  Physical Therapy: delayed Special Discharge needs: Sling (should bring with her). IceMan   Aleck LOISE Stalling, PA-C  12/28/2023 7:31 AM

## 2023-12-28 NOTE — Anesthesia Preprocedure Evaluation (Signed)
 Anesthesia Evaluation  Patient identified by MRN, date of birth, ID band Patient awake    Reviewed: Allergy & Precautions, NPO status , Patient's Chart, lab work & pertinent test results  History of Anesthesia Complications (+) PONV and history of anesthetic complications  Airway Mallampati: II  TM Distance: >3 FB Neck ROM: Limited    Dental  (+) Dental Advisory Given, Chipped   Pulmonary neg pulmonary ROS   Pulmonary exam normal        Cardiovascular negative cardio ROS Normal cardiovascular exam     Neuro/Psych  Neuromuscular disease  negative psych ROS   GI/Hepatic negative GI ROS, Neg liver ROS,,,  Endo/Other  negative endocrine ROS    Renal/GU negative Renal ROS     Musculoskeletal  (+) Arthritis ,  Fibromyalgia -  Abdominal   Peds  Hematology negative hematology ROS (+)   Anesthesia Other Findings   Reproductive/Obstetrics                              Anesthesia Physical Anesthesia Plan  ASA: 2  Anesthesia Plan: General   Post-op Pain Management: Regional block* and Tylenol  PO (pre-op)*   Induction: Intravenous  PONV Risk Score and Plan: 4 or greater and Treatment may vary due to age or medical condition, Ondansetron  and TIVA  Airway Management Planned: Oral ETT  Additional Equipment: None  Intra-op Plan:   Post-operative Plan: Extubation in OR  Informed Consent: I have reviewed the patients History and Physical, chart, labs and discussed the procedure including the risks, benefits and alternatives for the proposed anesthesia with the patient or authorized representative who has indicated his/her understanding and acceptance.     Dental advisory given  Plan Discussed with: CRNA and Anesthesiologist  Anesthesia Plan Comments:          Anesthesia Quick Evaluation

## 2023-12-29 ENCOUNTER — Inpatient Hospital Stay (HOSPITAL_COMMUNITY)

## 2023-12-29 ENCOUNTER — Other Ambulatory Visit: Payer: Self-pay

## 2023-12-29 ENCOUNTER — Encounter (HOSPITAL_COMMUNITY)
Admission: RE | Disposition: A | Discharge status: Home / Self Care | Payer: Self-pay | Source: Home / Self Care | Attending: Orthopaedic Surgery

## 2023-12-29 ENCOUNTER — Encounter (HOSPITAL_COMMUNITY): Payer: Self-pay | Admitting: Orthopaedic Surgery

## 2023-12-29 ENCOUNTER — Inpatient Hospital Stay (HOSPITAL_COMMUNITY): Payer: Self-pay | Admitting: Anesthesiology

## 2023-12-29 ENCOUNTER — Inpatient Hospital Stay (HOSPITAL_COMMUNITY)
Admission: RE | Admit: 2023-12-29 | Discharge: 2023-12-30 | DRG: 483 | Disposition: A | Discharge status: Home / Self Care | Attending: Orthopaedic Surgery | Admitting: Orthopaedic Surgery

## 2023-12-29 DIAGNOSIS — Z882 Allergy status to sulfonamides status: Secondary | ICD-10-CM | POA: Diagnosis not present

## 2023-12-29 DIAGNOSIS — Z79899 Other long term (current) drug therapy: Secondary | ICD-10-CM

## 2023-12-29 DIAGNOSIS — T84068A Wear of articular bearing surface of other internal prosthetic joint, initial encounter: Secondary | ICD-10-CM | POA: Diagnosis not present

## 2023-12-29 DIAGNOSIS — M797 Fibromyalgia: Secondary | ICD-10-CM | POA: Diagnosis present

## 2023-12-29 DIAGNOSIS — L409 Psoriasis, unspecified: Secondary | ICD-10-CM | POA: Diagnosis not present

## 2023-12-29 DIAGNOSIS — Z881 Allergy status to other antibiotic agents status: Secondary | ICD-10-CM

## 2023-12-29 DIAGNOSIS — M1990 Unspecified osteoarthritis, unspecified site: Secondary | ICD-10-CM | POA: Diagnosis present

## 2023-12-29 DIAGNOSIS — Y793 Surgical instruments, materials and orthopedic devices (including sutures) associated with adverse incidents: Secondary | ICD-10-CM | POA: Diagnosis present

## 2023-12-29 DIAGNOSIS — Z96612 Presence of left artificial shoulder joint: Secondary | ICD-10-CM | POA: Diagnosis not present

## 2023-12-29 DIAGNOSIS — T84098A Other mechanical complication of other internal joint prosthesis, initial encounter: Secondary | ICD-10-CM | POA: Diagnosis not present

## 2023-12-29 DIAGNOSIS — Z792 Long term (current) use of antibiotics: Secondary | ICD-10-CM

## 2023-12-29 DIAGNOSIS — Z791 Long term (current) use of non-steroidal anti-inflammatories (NSAID): Secondary | ICD-10-CM | POA: Diagnosis not present

## 2023-12-29 DIAGNOSIS — Z471 Aftercare following joint replacement surgery: Secondary | ICD-10-CM | POA: Diagnosis not present

## 2023-12-29 DIAGNOSIS — T849XXA Unspecified complication of internal orthopedic prosthetic device, implant and graft, initial encounter: Secondary | ICD-10-CM | POA: Diagnosis not present

## 2023-12-29 DIAGNOSIS — G8918 Other acute postprocedural pain: Secondary | ICD-10-CM | POA: Diagnosis not present

## 2023-12-29 HISTORY — PX: REVISION TOTAL SHOULDER TO REVERSE TOTAL SHOULDER: SHX6313

## 2023-12-29 SURGERY — REVISION, REVERSE TOTAL ARTHROPLASTY, SHOULDER
Anesthesia: General | Site: Shoulder | Laterality: Left

## 2023-12-29 MED ORDER — OXYCODONE HCL 5 MG PO TABS: 10.0000 mg | ORAL_TABLET | ORAL | Status: DC | PRN

## 2023-12-29 MED ORDER — DEXAMETHASONE SODIUM PHOSPHATE 4 MG/ML IJ SOLN
INTRAMUSCULAR | Status: DC | PRN
Start: 2023-12-29 — End: 2023-12-29
  Administered 2023-12-29: 5 mg via INTRAVENOUS

## 2023-12-29 MED ORDER — BUPIVACAINE LIPOSOME 1.3 % IJ SUSP
INTRAMUSCULAR | Status: DC | PRN
  Administered 2023-12-29: 10 mL via PERINEURAL

## 2023-12-29 MED ORDER — DOCUSATE SODIUM 100 MG PO CAPS
100.0000 mg | ORAL_CAPSULE | Freq: Two times a day (BID) | ORAL | Status: DC
  Administered 2023-12-29 – 2023-12-30 (×3): 100 mg via ORAL
  Filled 2023-12-29 (×3): qty 1

## 2023-12-29 MED ORDER — PROPOFOL 10 MG/ML IV BOLUS
INTRAVENOUS | Status: DC | PRN
  Administered 2023-12-29: 100 mg via INTRAVENOUS

## 2023-12-29 MED ORDER — PROPOFOL 10 MG/ML IV BOLUS
INTRAVENOUS | Status: AC
  Filled 2023-12-29: qty 20

## 2023-12-29 MED ORDER — VANCOMYCIN HCL IN DEXTROSE 1-5 GM/200ML-% IV SOLN
1000.0000 mg | Freq: Two times a day (BID) | INTRAVENOUS | Status: AC
  Administered 2023-12-29 – 2023-12-30 (×2): 1000 mg via INTRAVENOUS
  Filled 2023-12-29 (×2): qty 200

## 2023-12-29 MED ORDER — METHOCARBAMOL 500 MG PO TABS: 500.0000 mg | ORAL_TABLET | Freq: Four times a day (QID) | ORAL | Status: DC | PRN

## 2023-12-29 MED ORDER — FENTANYL CITRATE (PF) 100 MCG/2ML IJ SOLN
INTRAMUSCULAR | Status: DC | PRN
  Administered 2023-12-29: 50 ug via INTRAVENOUS

## 2023-12-29 MED ORDER — ORAL CARE MOUTH RINSE: 15.0000 mL | Freq: Once | OROMUCOSAL | Status: AC

## 2023-12-29 MED ORDER — SODIUM CHLORIDE 0.9 % IR SOLN
Status: DC | PRN
  Administered 2023-12-29: 1000 mL

## 2023-12-29 MED ORDER — NAPROXEN 250 MG PO TABS
250.0000 mg | ORAL_TABLET | Freq: Two times a day (BID) | ORAL | Status: DC
  Administered 2023-12-29 – 2023-12-30 (×2): 250 mg via ORAL
  Filled 2023-12-29 (×2): qty 1

## 2023-12-29 MED ORDER — ONDANSETRON HCL 4 MG/2ML IJ SOLN
INTRAMUSCULAR | Status: DC | PRN
  Administered 2023-12-29: 4 mg via INTRAVENOUS

## 2023-12-29 MED ORDER — PHENYLEPHRINE HCL-NACL 20-0.9 MG/250ML-% IV SOLN
INTRAVENOUS | Status: DC | PRN
  Administered 2023-12-29: 50 ug/min via INTRAVENOUS

## 2023-12-29 MED ORDER — METOCLOPRAMIDE HCL 5 MG/ML IJ SOLN: 5.0000 mg | Freq: Three times a day (TID) | INTRAMUSCULAR | Status: DC | PRN

## 2023-12-29 MED ORDER — FENTANYL CITRATE (PF) 100 MCG/2ML IJ SOLN
INTRAMUSCULAR | Status: AC
  Filled 2023-12-29: qty 2

## 2023-12-29 MED ORDER — SUGAMMADEX SODIUM 200 MG/2ML IV SOLN
INTRAVENOUS | Status: DC | PRN
  Administered 2023-12-29: 150 mg via INTRAVENOUS

## 2023-12-29 MED ORDER — ONDANSETRON HCL 4 MG/2ML IJ SOLN: 4.0000 mg | Freq: Four times a day (QID) | INTRAMUSCULAR | Status: DC | PRN

## 2023-12-29 MED ORDER — TRANEXAMIC ACID-NACL 1000-0.7 MG/100ML-% IV SOLN
1000.0000 mg | INTRAVENOUS | Status: AC
  Administered 2023-12-29: 1000 mg via INTRAVENOUS
  Filled 2023-12-29: qty 100

## 2023-12-29 MED ORDER — METHOCARBAMOL 1000 MG/10ML IJ SOLN: 500.0000 mg | Freq: Four times a day (QID) | INTRAMUSCULAR | Status: DC | PRN

## 2023-12-29 MED ORDER — METOCLOPRAMIDE HCL 5 MG PO TABS: 5.0000 mg | ORAL_TABLET | Freq: Three times a day (TID) | ORAL | Status: DC | PRN

## 2023-12-29 MED ORDER — LIDOCAINE HCL (CARDIAC) PF 100 MG/5ML IV SOSY
PREFILLED_SYRINGE | INTRAVENOUS | Status: DC | PRN
Start: 2023-12-29 — End: 2023-12-29
  Administered 2023-12-29: 20 mg via INTRAVENOUS

## 2023-12-29 MED ORDER — BISACODYL 10 MG RE SUPP: 10.0000 mg | Freq: Every day | RECTAL | Status: DC | PRN

## 2023-12-29 MED ORDER — HYDROMORPHONE HCL 1 MG/ML IJ SOLN: 0.5000 mg | INTRAMUSCULAR | Status: DC | PRN

## 2023-12-29 MED ORDER — STERILE WATER FOR IRRIGATION IR SOLN
Status: DC | PRN
  Administered 2023-12-29: 1000 mL

## 2023-12-29 MED ORDER — ONDANSETRON HCL 4 MG PO TABS: 4.0000 mg | ORAL_TABLET | Freq: Four times a day (QID) | ORAL | Status: DC | PRN

## 2023-12-29 MED ORDER — PHENOL 1.4 % MT LIQD: 1.0000 | OROMUCOSAL | Status: DC | PRN

## 2023-12-29 MED ORDER — SODIUM CHLORIDE 0.9 % IV SOLN
2.0000 g | INTRAVENOUS | Status: DC
  Administered 2023-12-29: 2 g via INTRAVENOUS
  Filled 2023-12-29: qty 20

## 2023-12-29 MED ORDER — DEXAMETHASONE SODIUM PHOSPHATE 10 MG/ML IJ SOLN
INTRAMUSCULAR | Status: AC
  Filled 2023-12-29: qty 1

## 2023-12-29 MED ORDER — LACTATED RINGERS IV SOLN: INTRAVENOUS | Status: DC

## 2023-12-29 MED ORDER — ONDANSETRON HCL 4 MG/2ML IJ SOLN: 4.0000 mg | Freq: Once | INTRAMUSCULAR | Status: DC | PRN

## 2023-12-29 MED ORDER — OXYCODONE HCL 5 MG PO TABS: 5.0000 mg | ORAL_TABLET | ORAL | Status: DC | PRN

## 2023-12-29 MED ORDER — ROCURONIUM BROMIDE 100 MG/10ML IV SOLN
INTRAVENOUS | Status: DC | PRN
  Administered 2023-12-29: 50 mg via INTRAVENOUS

## 2023-12-29 MED ORDER — OXYCODONE HCL 5 MG PO TABS: 5.0000 mg | ORAL_TABLET | Freq: Once | ORAL | Status: DC | PRN

## 2023-12-29 MED ORDER — DIPHENHYDRAMINE HCL 12.5 MG/5ML PO ELIX: 12.5000 mg | ORAL_SOLUTION | ORAL | Status: DC | PRN

## 2023-12-29 MED ORDER — MENTHOL 3 MG MT LOZG: 1.0000 | LOZENGE | OROMUCOSAL | Status: DC | PRN

## 2023-12-29 MED ORDER — BUPIVACAINE HCL (PF) 0.5 % IJ SOLN
INTRAMUSCULAR | Status: DC | PRN
Start: 2023-12-29 — End: 2023-12-29
  Administered 2023-12-29: 15 mL via PERINEURAL

## 2023-12-29 MED ORDER — FENTANYL CITRATE PF 50 MCG/ML IJ SOSY: 25.0000 ug | PREFILLED_SYRINGE | INTRAMUSCULAR | Status: DC | PRN

## 2023-12-29 MED ORDER — POLYETHYLENE GLYCOL 3350 17 G PO PACK: 17.0000 g | PACK | Freq: Every day | ORAL | Status: DC | PRN

## 2023-12-29 MED ORDER — ACETAMINOPHEN 500 MG PO TABS
1000.0000 mg | ORAL_TABLET | Freq: Once | ORAL | Status: AC
  Administered 2023-12-29: 1000 mg via ORAL

## 2023-12-29 MED ORDER — 0.9 % SODIUM CHLORIDE (POUR BTL) OPTIME
TOPICAL | Status: DC | PRN
  Administered 2023-12-29: 1000 mL

## 2023-12-29 MED ORDER — PHENYLEPHRINE HCL (PRESSORS) 10 MG/ML IV SOLN
INTRAVENOUS | Status: DC | PRN
  Administered 2023-12-29 (×3): 80 ug via INTRAVENOUS

## 2023-12-29 MED ORDER — FENTANYL CITRATE PF 50 MCG/ML IJ SOSY
50.0000 ug | PREFILLED_SYRINGE | Freq: Once | INTRAMUSCULAR | Status: AC
  Administered 2023-12-29: 50 ug via INTRAVENOUS
  Filled 2023-12-29: qty 2

## 2023-12-29 MED ORDER — CHLORHEXIDINE GLUCONATE 0.12 % MT SOLN
15.0000 mL | Freq: Once | OROMUCOSAL | Status: AC
  Administered 2023-12-29: 15 mL via OROMUCOSAL

## 2023-12-29 MED ORDER — ACETAMINOPHEN 500 MG PO TABS
1000.0000 mg | ORAL_TABLET | Freq: Four times a day (QID) | ORAL | Status: DC
Start: 2023-12-29 — End: 2023-12-30
  Administered 2023-12-29 – 2023-12-30 (×3): 1000 mg via ORAL
  Filled 2023-12-29 (×4): qty 2

## 2023-12-29 MED ORDER — LACTATED RINGERS IV SOLN: INTRAVENOUS | Status: DC | PRN

## 2023-12-29 MED ORDER — ONDANSETRON HCL 4 MG/2ML IJ SOLN
INTRAMUSCULAR | Status: AC
  Filled 2023-12-29: qty 2

## 2023-12-29 MED ORDER — VANCOMYCIN HCL 1 G IV SOLR
INTRAVENOUS | Status: DC | PRN
  Administered 2023-12-29: 1000 mg via TOPICAL

## 2023-12-29 MED ORDER — EPHEDRINE SULFATE (PRESSORS) 50 MG/ML IJ SOLN
INTRAMUSCULAR | Status: DC | PRN
  Administered 2023-12-29 (×2): 5 mg via INTRAVENOUS

## 2023-12-29 MED ORDER — ACETAMINOPHEN 500 MG PO TABS
1000.0000 mg | ORAL_TABLET | Freq: Once | ORAL | Status: DC
  Filled 2023-12-29: qty 2

## 2023-12-29 MED ORDER — ACETAMINOPHEN 325 MG PO TABS: 325.0000 mg | ORAL_TABLET | Freq: Four times a day (QID) | ORAL | Status: DC | PRN

## 2023-12-29 MED ORDER — OXYCODONE HCL 5 MG/5ML PO SOLN: 5.0000 mg | Freq: Once | ORAL | Status: DC | PRN

## 2023-12-29 MED ORDER — VANCOMYCIN HCL 1000 MG IV SOLR
INTRAVENOUS | Status: AC
  Filled 2023-12-29: qty 20

## 2023-12-29 MED ORDER — MAGNESIUM CITRATE PO SOLN: 1.0000 | Freq: Once | ORAL | Status: DC | PRN

## 2023-12-29 MED ORDER — CEFAZOLIN SODIUM-DEXTROSE 2-4 GM/100ML-% IV SOLN
2.0000 g | INTRAVENOUS | Status: AC
  Administered 2023-12-29: 2 g via INTRAVENOUS
  Filled 2023-12-29: qty 100

## 2023-12-29 MED ORDER — MIDAZOLAM HCL 2 MG/2ML IJ SOLN: 1.0000 mg | Freq: Once | INTRAMUSCULAR | Status: DC

## 2023-12-29 SURGICAL SUPPLY — 58 items
BAG COUNTER SPONGE SURGICOUNT (BAG) ×1 IMPLANT
BASEPLATE GLENOID RSA 3X25 0D (Shoulder) IMPLANT
BIT DRILL 3.2 PERIPHERAL SCREW (BIT) IMPLANT
BLADE SAW SGTL 73X25 THK (BLADE) ×1 IMPLANT
BLADE SAW SGTL 81X20 HD (BLADE) IMPLANT
BODY PROXIMAL PTC 13X132.5 (Joint) IMPLANT
CAP LOCKING COCR (Cap) IMPLANT
CHLORAPREP W/TINT 26 (MISCELLANEOUS) ×2 IMPLANT
CLSR STERI-STRIP ANTIMIC 1/2X4 (GAUZE/BANDAGES/DRESSINGS) ×1 IMPLANT
CNTNR URN SCR LID CUP LEK RST (MISCELLANEOUS) IMPLANT
COOLER ICEMAN CLASSIC (MISCELLANEOUS) ×1 IMPLANT
COVER BACK TABLE 60X90IN (DRAPES) ×1 IMPLANT
COVER SURGICAL LIGHT HANDLE (MISCELLANEOUS) ×1 IMPLANT
DRAPE C-ARM 42X120 X-RAY (DRAPES) IMPLANT
DRAPE INCISE IOBAN 66X45 STRL (DRAPES) ×1 IMPLANT
DRAPE POUCH INSTRU U-SHP 10X18 (DRAPES) ×1 IMPLANT
DRAPE SHEET LG 3/4 BI-LAMINATE (DRAPES) ×2 IMPLANT
DRAPE SURG ORHT 6 SPLT 77X108 (DRAPES) ×2 IMPLANT
DRSG AQUACEL AG ADV 3.5X 6 (GAUZE/BANDAGES/DRESSINGS) ×1 IMPLANT
ELECT BLADE TIP CTD 4 INCH (ELECTRODE) ×1 IMPLANT
ELECT PENCIL ROCKER SW 15FT (MISCELLANEOUS) ×1 IMPLANT
ELECT REM PT RETURN 15FT ADLT (MISCELLANEOUS) ×1 IMPLANT
FACESHIELD WRAPAROUND OR TEAM (MASK) ×3 IMPLANT
GLENOSPHERE REV SHOULDER 36 (Joint) IMPLANT
GLOVE BIO SURGEON STRL SZ 6.5 (GLOVE) ×2 IMPLANT
GLOVE BIOGEL PI IND STRL 6.5 (GLOVE) ×1 IMPLANT
GLOVE BIOGEL PI IND STRL 8 (GLOVE) ×1 IMPLANT
GLOVE ECLIPSE 8.0 STRL XLNG CF (GLOVE) ×2 IMPLANT
GOWN STRL REUS W/ TWL LRG LVL3 (GOWN DISPOSABLE) ×2 IMPLANT
GUIDEWIRE GLENOID 2.5X220 (WIRE) IMPLANT
IMPL REVERSE SHOULDER 0X3.5 (Shoulder) IMPLANT
INSERT HUMERAL 36X6MM 12.5DEG (Insert) IMPLANT
KIT BASIN OR (CUSTOM PROCEDURE TRAY) ×1 IMPLANT
KIT STABILIZATION SHOULDER (MISCELLANEOUS) ×1 IMPLANT
KIT TURNOVER KIT A (KITS) ×1 IMPLANT
MANIFOLD NEPTUNE II (INSTRUMENTS) ×1 IMPLANT
NS IRRIG 1000ML POUR BTL (IV SOLUTION) ×1 IMPLANT
PACK SHOULDER (CUSTOM PROCEDURE TRAY) ×1 IMPLANT
PAD COLD SHLDR WRAP-ON (PAD) ×1 IMPLANT
RESTRAINT HEAD UNIVERSAL NS (MISCELLANEOUS) ×1 IMPLANT
SCREW 5.0X18 (Screw) IMPLANT
SCREW BONE SMALL REVERSED 15 (Screw) IMPLANT
SCREW PERIPHERAL 42 (Screw) IMPLANT
SCREW SHLD ASSEMBLY AEQ 20 (Screw) IMPLANT
SET HNDPC FAN SPRY TIP SCT (DISPOSABLE) ×1 IMPLANT
SPACER SHLD CMT AEQ 13X20 (Shoulder) IMPLANT
SPONGE T-LAP 4X18 ~~LOC~~+RFID (SPONGE) IMPLANT
STEM HUM PTC DISTAL 13X130 (Miscellaneous) IMPLANT
SUCTION TUBE FRAZIER 12FR DISP (SUCTIONS) IMPLANT
SUT ETHIBOND 2 V 37 (SUTURE) ×1 IMPLANT
SUT ETHIBOND NAB CT1 #1 30IN (SUTURE) ×1 IMPLANT
SUT MNCRL AB 4-0 PS2 18 (SUTURE) ×1 IMPLANT
SUT VIC AB 0 CT1 36 (SUTURE) IMPLANT
SUT VIC AB 3-0 SH 27X BRD (SUTURE) ×1 IMPLANT
SUTURE FIBERWR #5 38 CONV NDL (SUTURE) ×2 IMPLANT
TOWEL OR 17X26 10 PK STRL BLUE (TOWEL DISPOSABLE) ×1 IMPLANT
TUBE SUCTION HIGH CAP CLEAR NV (SUCTIONS) ×1 IMPLANT
WATER STERILE IRR 1000ML POUR (IV SOLUTION) ×2 IMPLANT

## 2023-12-29 NOTE — Anesthesia Procedure Notes (Signed)
 Anesthesia Regional Block: Interscalene brachial plexus block   Pre-Anesthetic Checklist: , timeout performed,  Correct Patient, Correct Site, Correct Laterality,  Correct Procedure, Correct Position, site marked,  Risks and benefits discussed,  Surgical consent,  Pre-op evaluation,  At surgeon's request and post-op pain management  Laterality: Left  Prep: chloraprep       Needles:  Injection technique: Single-shot  Needle Type: Echogenic Needle     Needle Length: 5cm  Needle Gauge: 21     Additional Needles:   Narrative:  Start time: 12/29/2023 7:48 AM End time: 12/29/2023 7:51 AM Injection made incrementally with aspirations every 5 mL.  Performed by: Personally  Anesthesiologist: Lucious Debby BRAVO, MD  Additional Notes: No pain on injection. No increased resistance to injection. Injection made in 5cc increments. Good needle visualization. Patient tolerated the procedure well.

## 2023-12-29 NOTE — Interval H&P Note (Signed)
 All questions answered, patient wants to proceed with procedure. ? ?

## 2023-12-29 NOTE — Addendum Note (Signed)
 Addendum  created 12/29/23 1242 by Dartha Meckel, CRNA   Flowsheet accepted

## 2023-12-29 NOTE — Anesthesia Procedure Notes (Signed)
 Procedure Name: Intubation Date/Time: 12/29/2023 8:28 AM  Performed by: Dartha Meckel, CRNAPre-anesthesia Checklist: Patient identified, Emergency Drugs available, Suction available and Patient being monitored Patient Re-evaluated:Patient Re-evaluated prior to induction Oxygen Delivery Method: Circle system utilized Preoxygenation: Pre-oxygenation with 100% oxygen Induction Type: IV induction Ventilation: Mask ventilation without difficulty Laryngoscope Size: Glidescope and 3 Grade View: Grade I Tube type: Oral Tube size: 7.0 mm Number of attempts: 1 Airway Equipment and Method: Stylet and Oral airway Placement Confirmation: ETT inserted through vocal cords under direct vision, positive ETCO2 and breath sounds checked- equal and bilateral Secured at: 20 cm Tube secured with: Tape Dental Injury: Teeth and Oropharynx as per pre-operative assessment

## 2023-12-29 NOTE — Plan of Care (Signed)
  Problem: Nutrition: Goal: Adequate nutrition will be maintained Outcome: Progressing   Problem: Coping: Goal: Level of anxiety will decrease Outcome: Progressing   Problem: Elimination: Goal: Will not experience complications related to urinary retention Outcome: Progressing   Problem: Pain Managment: Goal: General experience of comfort will improve and/or be controlled Outcome: Progressing   Problem: Safety: Goal: Ability to remain free from injury will improve Outcome: Progressing   Problem: Skin Integrity: Goal: Risk for impaired skin integrity will decrease Outcome: Progressing   Problem: Pain Management: Goal: Pain level will decrease with appropriate interventions Outcome: Progressing

## 2023-12-29 NOTE — Anesthesia Postprocedure Evaluation (Signed)
 Anesthesia Post Note  Patient: Melanie Golden  Procedure(s) Performed: REVISION, REVERSE TOTAL ARTHROPLASTY, SHOULDER (Left: Shoulder)     Patient location during evaluation: PACU Anesthesia Type: General Level of consciousness: awake and alert Pain management: pain level controlled Vital Signs Assessment: post-procedure vital signs reviewed and stable Respiratory status: spontaneous breathing, nonlabored ventilation and respiratory function stable Cardiovascular status: stable and blood pressure returned to baseline Anesthetic complications: no   No notable events documented.  Last Vitals:  Vitals:   12/29/23 1115 12/29/23 1134  BP: 124/78 131/86  Pulse: 71 74  Resp: 15 14  Temp:  36.6 C  SpO2: 100% 100%    Last Pain:  Vitals:   12/29/23 1134  TempSrc: Oral  PainSc: 0-No pain                 Debby FORBES Like

## 2023-12-29 NOTE — Op Note (Signed)
 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  Patient had concern for infection versus poly wear, there was resorption of the entire calcar and most of the stem was exposed.  The glenoid component was well-fixed however she has eroded away and significant out of the poly as well as the bone.  She had an extremely small glenoid and it was very difficult to obtain good glenoid purchase.  We went to a long stemmed post however I have worries about fracture versus any growth issues.  That said I felt that a single-stage revision was appropriate for her.  We used a long stemmed diaphyseal fit implant for distal fixation of the humerus.  Patient will hold on therapy for 4 weeks.  She had extreme instability preoperatively both posterior and anterior.  It was clear that she was easily dislocatable in both until her actions.  Her deltoid did appear to be intact though.  Axillary tug test was unable to be performed.  Post-operative plan: The patient will be NWB in sling.  The patient will be will be admitted to observation due to medical complexity, monitoring and pain management.  DVT prophylaxis Aspirin  81 mg twice daily for 6 weeks.  Pain control with PRN pain medication preferring oral medicines.  Follow up plan will be scheduled in approximately 7 days for incision check and XR.  Physical therapy to start after 4 weeks.  Implants: Tornier revive size 13 stem by 130 mm, 20 , 13 proximal body, size 0 high offset tray, +6 poly, 36 standard glenosphere with a 25+3 baseplate, long ingrowth post and 2 peripheral screws Post-Op Diagnosis: Same Surgeons:Primary: Cristy Bonner DASEN, MD Assistants:Caroline  McBane, PA-C Location: [facility] ROOM 09 Anesthesia: General with Exparel  Interscalene Antibiotics: Ancef  2g preop, Vancomycin  1000mg  locally Tourniquet time: None Estimated Blood Loss: 100 Complications: None Specimens: 3 for culture, hold for 2 weeks rule out C acnes Implants: Implant Name Type Inv. Item Serial No. Manufacturer Lot No. LRB No. Used Action  SCREW BONE SMALL REVERSED 15 - [id number] Screw SCREW BONE SMALL REVERSED 15 [id number] [employer]  Left 1 Implanted  BASEPLATE GLENOID RSA 3X25 0D - [id number] Shoulder BASEPLATE GLENOID RSA 3X25 0D [id number] [employer]  Left 1 Implanted  SCREW PERIPHERAL 42 - [id number] Screw SCREW PERIPHERAL 42  [employer]  Left 1 Implanted  SCREW 5.0X18 - [id number] Screw SCREW 5.0X18  [employer]  Left 1 Implanted  GLENOSPHERE REV SHOULDER 36 - [id number] Joint GLENOSPHERE REV SHOULDER 36 [id number] [employer]  Left 1 Implanted  SCREW SHLD ASSEMBLY AEQ 20 - [id number] Screw SCREW SHLD ASSEMBLY AEQ 20 [id number] [employer]  Left 1 Implanted  IMPL REVERSE SHOULDER 0X3.5 - [id number] Shoulder IMPL REVERSE SHOULDER 0X3.5 [id number] [employer]  Left 1 Implanted  BODY PROXIMAL PTC 13X132.5 - [id number] Joint BODY PROXIMAL PTC 13X132.5 [id number] [employer]  Left 1 Implanted  INSERT HUMERAL 36X6MM 12.5DEG - [id number] Insert INSERT HUMERAL 36X6MM 12.5DEG [id number] [employer]  Left 1 Implanted  CAP [place] - [id number] Cap CAP [place] [id number] [employer]  Left 1 Implanted    Indications for Surgery:   Melanie Golden is a 73 y.o. female with failed left total shoulder arthroplasty with concern for infection  versus cuff failure.  Benefits and risks of operative and nonoperative management were discussed prior to surgery with patient/guardian(s) and informed consent form was completed.  Infection and need for further surgery were discussed as was prosthetic stability and cuff issues.  We additionally specifically discussed risks of  axillary nerve injury, infection, periprosthetic fracture, continued pain and longevity of implants prior to beginning procedure.      Procedure:   The patient was identified in the preoperative holding area where the surgical site was marked. Block placed by anesthesia with exparel .  The patient was taken to the OR where a procedural timeout was called and the above noted anesthesia was induced.  The patient was positioned beachchair on allen table with spider arm positioner.  Preoperative antibiotics were dosed.  The patient's left shoulder was prepped and draped in the usual sterile fashion.  A second preoperative timeout was called.       We began with a standard deltopectoral approach using the previous incision, with the skin sharply to hemostasis progressed.  We identified the deltopectoral interval and bluntly dissected it.  We placed blunt retractors.  We exposed the subscapularis which was thin but intact.  We peeled this back and noted there is a superior cuff tear.  The joint was extremely unstable preoperatively.  We were able to peel back the anterior inferior capsule and the subscapularis and placed retention sutures.  We then exposed and placed blunt retractors around the humeral component.  There is significant osteolysis and resorption of the calcar and most of the medial aspect of the stem was exposed.  We were able to remove fair amount of unhealthy appearing synovium and possible fibrinous versus purulent material.  We able to perform with complete synovectomy of the joint and remove the humeral implant.  Once this is exposed we cleared washed with 3 L normal saline the entire joint.  At this point we had removed her humeral implants and went to the glenoid.  We are able to remove the polyethylene in the center post per the [employer] instructions.  At that point perform synovectomy around the glenoid.  We released subscapularis in order to create more mobility.  There is clear  instability and posterior capsular redundant tissue.  I felt that it was clear that there is posterior instability.  At this point I felt that removing the glenoid was the next appropriate step.  We cut the glenoid into 3 fragments able to remove each of the peripheral fragments and then unscrewed the central fragment.  We used the cuff fine from the [employer]omet set to remove the central post.  There was very little anterior bone though the periphery of the face of the glenoid is intact there was clear perforation deep in the anterior portion of the vault.  The initial implant in the placed in retroversion which limited the anterior bone around our central hole.  I did feel that using the same hole would allow implantation however if I tried to use an alternate centerline technique would likely breach and caused an uncontained defect.  We reamed and placed a long central post implant attached to a 25+3 baseplate.  We had reasonable purchase with this and 2 peripheral screws including a 42 peripheral screw allowed better fixation.  We then removed any peripheral osteophytes and placed a 36 standard glenosphere.  We used Irrisept solution and irrigated out.  This point we turned her attention back to the humerus.  Her humeral components already been removed.  We were able to irrigate again and broach with the revive system selecting the above implant.  We placed FiberWire's in the tuberosity.  We trimmed the superior tuberosity down to avoid impingement.  We had reasonable fit with our trials and good stability.  We placed her final implants and reduced the joint.  We irrigated again with 3 L normal saline.  At this point we placed our sutures from around the stem into the subscapularis.  I was able to perform the repair with good tissue to bone apposition.  We placed local vancomycin  powder and closed incision multi fashion finishing with nonabsorbable suture.  Sterile dressing and sling were placed.  Patient  awoken taken back in stable condition.   Aleck Stalling, PA-C, present and scrubbed throughout the case, critical for completion in a timely fashion, and for retraction, instrumentation, closure.

## 2023-12-29 NOTE — Addendum Note (Signed)
 Addendum  created 12/29/23 1405 by Dartha Meckel, CRNA   Flowsheet accepted

## 2023-12-29 NOTE — Transfer of Care (Signed)
 Immediate Anesthesia Transfer of Care Note  Patient: Melanie Golden  Procedure(s) Performed: REVISION, REVERSE TOTAL ARTHROPLASTY, SHOULDER (Left: Shoulder)  Patient Location: PACU  Anesthesia Type:MAC  Level of Consciousness: awake and alert   Airway & Oxygen Therapy: Patient Spontanous Breathing and Patient connected to nasal cannula oxygen  Post-op Assessment: Report given to RN and Post -op Vital signs reviewed and stable  Post vital signs: Reviewed and stable  Last Vitals:  Vitals Value Taken Time  BP 127/80 12/29/23 10:33  Temp    Pulse 78 12/29/23 10:35  Resp 16 12/29/23 10:35  SpO2 100 % 12/29/23 10:35  Vitals shown include unfiled device data.  Last Pain:  Vitals:   12/29/23 0653  TempSrc:   PainSc: 0-No pain         Complications: No notable events documented.

## 2023-12-30 ENCOUNTER — Encounter (HOSPITAL_COMMUNITY): Payer: Self-pay | Admitting: Orthopaedic Surgery

## 2023-12-30 MED ORDER — OMEPRAZOLE 20 MG PO CPDR: 20.0000 mg | DELAYED_RELEASE_CAPSULE | Freq: Every day | ORAL | 0 refills | Status: AC

## 2023-12-30 MED ORDER — MELOXICAM 15 MG PO TABS: 15.0000 mg | ORAL_TABLET | Freq: Every day | ORAL | 0 refills | Status: AC

## 2023-12-30 MED ORDER — OXYCODONE HCL 5 MG PO TABS: ORAL_TABLET | ORAL | 0 refills | Status: AC

## 2023-12-30 MED ORDER — DOXYCYCLINE HYCLATE 100 MG PO CAPS: 100.0000 mg | ORAL_CAPSULE | Freq: Two times a day (BID) | ORAL | 0 refills | Status: AC

## 2023-12-30 MED ORDER — ACETAMINOPHEN 500 MG PO TABS: 1000.0000 mg | ORAL_TABLET | Freq: Three times a day (TID) | ORAL | 0 refills | Status: AC

## 2023-12-30 MED ORDER — ASPIRIN 81 MG PO CHEW: 81.0000 mg | CHEWABLE_TABLET | Freq: Two times a day (BID) | ORAL | 0 refills | Status: AC

## 2023-12-30 MED ORDER — ONDANSETRON HCL 4 MG PO TABS: 4.0000 mg | ORAL_TABLET | Freq: Three times a day (TID) | ORAL | 0 refills | Status: AC | PRN

## 2023-12-30 NOTE — Progress Notes (Signed)
   ORTHOPAEDIC PROGRESS NOTE  s/p Procedure(s): REVISION, REVERSE TOTAL ARTHROPLASTY, SHOULDER  SUBJECTIVE: Patient doing well this morning. Nerve block still working. She requests PT before she discharges home. Sling was adjusted this morning.    No chest pain. No SOB. No nausea/vomiting. No other complaints.  OBJECTIVE: PE: General: resting in hospital bed, NAD Cardiac: regular rate Pulmonary: no increased work of breathing LUE: Dressing CDI and sling well fitting.  Axillary nerve sensation/motor altered in setting of block and unable to be fully tested.  Distal motor and sensory altered in setting of block.   Vitals:   12/30/23 0100 12/30/23 0300  BP: 112/71 109/66  Pulse: 71 62  Resp: 17 16  Temp: 98.4 F (36.9 C) 98.2 F (36.8 C)  SpO2: 98% 98%    Opiates Today (MME): Today's  total administered Morphine  Milligram Equivalents: 0 Opiates Yesterday (MME): Yesterday's total administered Morphine  Milligram Equivalents: 30 Opiates Used in the last two days:  Inpatient Morphine  Milligram Equivalents Per Day 8/27 - 8/28   Values displayed are in units of MME/Day    Order Start / End Date Yesterday Today    oxyCODONE  (Oxy IR/ROXICODONE ) immediate release tablet 5 mg 8/27 - 8/27 0 of Unknown --    oxyCODONE  (ROXICODONE ) 5 MG/5ML solution 5 mg 8/27 - 8/27 0 of Unknown --      Group total: 0 of Unknown     fentaNYL  (SUBLIMAZE ) injection 25-50 mcg 8/27 - 8/27 0 of 45-90 --    fentaNYL  (SUBLIMAZE ) injection 50-100 mcg 8/27 - 8/27 15 of 15-30 --    fentaNYL  (SUBLIMAZE ) injection 8/27 - 8/27 *15 of 15 --    HYDROmorphone  (DILAUDID ) injection 0.5 mg 8/27 - No end date 0 of 30 0 of 60    oxyCODONE  (Oxy IR/ROXICODONE ) immediate release tablet 5-10 mg 8/27 - No end date 0 of 22.5-45 0 of 45-90    oxyCODONE  (Oxy IR/ROXICODONE ) immediate release tablet 10-15 mg 8/27 - No end date 0 of 45-67.5 0 of 90-135    Daily Totals  * 30 of Unknown (at least 172.5-277.5) 0 of 195-285  *One-Step  medication  Calculation Errors     Order Type Date Details   oxyCODONE  (Oxy IR/ROXICODONE ) immediate release tablet 5 mg Ordered Dose -- Insufficient frequency information   oxyCODONE  (ROXICODONE ) 5 MG/5ML solution 5 mg Ordered Dose -- Insufficient frequency information           Stable post-op images.   ASSESSMENT: Melanie Golden is a 73 y.o. female POD#1  PLAN: Weightbearing: NWB LUE Insicional and dressing care: Reinforce dressings as needed Orthopedic device(s): Sling Showering: Post-op day #2  VTE prophylaxis: Aspirin  81 mg BID Pain control: PRN pain medications, minimize narcotics as able Follow - up plan: 1 week in office Dispo: Discharge home today. PT/OT evals prior to discharge per patient request. However we discussed she will have no shoulder therapy for 4 weeks.  Contact information:  Weekdays 8-5 Dr. Bonner Hair, Aleck Stalling PA-C, After hours and holidays please check [email or web address] for group call information for [facility]   Aleck Stalling, PA-C 12/30/23

## 2023-12-30 NOTE — Progress Notes (Signed)
 Discharge package printed and instructions given to pt. And husband. Verbalize understanding.

## 2023-12-30 NOTE — Evaluation (Signed)
 Occupational Therapy Evaluation Patient Details Name: Melanie Golden MRN: [id number] DOB: 1950-09-14 Today's Date: 12/30/2023   History of Present Illness   Melanie Golden is a 73 yo female s/p Left revision to reverse total shoulder arthroplasty on 12/29/23. PMH: includes but no limited to hx of L reverse TSA, OA, constipation, fibromyalgia, PONV     Clinical Impressions PTA pt lives at home with husband who is present for therapy education and training this session. Husband assists PTA with amb and stairs as well as showers and higher level IADL's and reports he is able to do so post discharge.  Education completed regarding compensatory strategies for ADL tasks and functional mobility, management of sling, L  ROM per specified parameters in the order set as indicated below, positioning of operative arm in sitting and supine and edema control, including use of Iceman Cold Therapy machine. Caregiver present for education, written handouts provided and reviewed using Teach Back and pt/caregiver verbalized/demonstrated understanding. Due to the below listed deficits, pt requires minimal assistance with ADL tasks and supervision assist on level surfaces for ADL's with functional mobility. Caregiver will be able to provide necessary level of assistance at discharge. Pt to follow up with MD to progress rehab of the operative shoulder.       If plan is discharge home, recommend the following:   A little help with walking and/or transfers;A little help with bathing/dressing/bathroom;Assistance with cooking/housework;Assistance with feeding;Assist for transportation;Help with stairs or ramp for entrance     Functional Status Assessment   Patient has had a recent decline in their functional status and demonstrates the ability to make significant improvements in function in a reasonable and predictable amount of time.     Equipment Recommendations   None recommended by OT       Precautions/Restrictions   Precautions Precautions: Fall;Shoulder Precaution Booklet Issued: Yes (comment) Recall of Precautions/Restrictions: Intact Required Braces or Orthoses: Sling Restrictions Weight Bearing Restrictions Per Provider Order: Yes LUE Weight Bearing Per Provider Order: Non weight bearing     Mobility Bed Mobility               General bed mobility comments: in recliner and remained but OT education re: bed mobility and positioning completed    Transfers Overall transfer level: Needs assistance Equipment used: None Transfers: Sit to/from Stand, Bed to chair/wheelchair/BSC Sit to Stand: Supervision     Step pivot transfers: Supervision     General transfer comment: improved significantly with mobility once up and about, husband educated in use of gait belt at all times and support and assist for amb and steps with + teach back demonstrated      Balance Overall balance assessment: Needs assistance Sitting-balance support: Feet supported Sitting balance-Leahy Scale: Good     Standing balance support: During functional activity Standing balance-Leahy Scale: Fair                             ADL either performed or assessed with clinical judgement   ADL Overall ADL's : Needs assistance/impaired    Per orders, L shoulder parameters as follows for ADL tasks: No shoulder ROM; elbow/wrist/hand ROM only. While moving within specified parameters, pt/caregiver instructed on bathing and how to donn/doff shirt, placing operative arm through sleeve first when donning and off last when doffing.Pt/caregiver educated on compensatory strategies for LB ADL and strategies to reduce risk of falls.  Pt/caregiver educated on donning/doffing sling and to wear the  sling at all times with the exception of ADL, and to loosen the neck strap of the sling when the operative arm is in a supported position when sitting. In sitting or supine, pt instructed to have  a pillow behind and under their operative arm to provide support. If assist needed with ambulation, caregiver educated on the importance of walking on pt's non-operative side.  Education regarding use of IceMan Cold Therapy completed, including the importance of using a barrier on the shoulder prior to positioning the wrap-on pad. Pt/caregiver verbalized/demonstrated understanding. Teach Back used while caregiver assisted with dressing pt and positioning wrap-on pad to facilitate DC. Husband present and able to teach back all above training.                                           Vision Baseline Vision/History: 1 Wears glasses;0 No visual deficits              Pertinent Vitals/Pain Pain Assessment Pain Assessment: Faces Faces Pain Scale: Hurts little more Pain Location: abdomen and L UE discomfort generalized Pain Descriptors / Indicators: Discomfort Pain Intervention(s): Limited activity within patient's tolerance, Monitored during session, Premedicated before session, Repositioned, Relaxation, Ice applied     Extremity/Trunk Assessment Upper Extremity Assessment Upper Extremity Assessment: Defer to OT evaluation   Lower Extremity Assessment Lower Extremity Assessment: Generalized weakness   Cervical / Trunk Assessment Cervical / Trunk Assessment: Kyphotic   Communication Communication Communication: No apparent difficulties   Cognition Arousal: Alert Behavior During Therapy: WFL for tasks assessed/performed Cognition: No apparent impairments                               Following commands: Intact       Cueing  General Comments   Cueing Techniques: Verbal cues;Gestural cues  anxious throughout, pt attributes dizziness to glasses, resolves with rest, VSS   Exercises Exercises: Shoulder Shoulder Exercises Elbow Flexion: 10 reps, AAROM, Left Elbow Extension: AAROM, 10 reps, Left Wrist Flexion: AAROM, Left, 10 reps Wrist  Extension: AAROM, 10 reps, Left Digit Composite Flexion: Left, 10 reps, AAROM Composite Extension: AAROM, Left, 10 reps   Shoulder Instructions Shoulder Instructions Donning/doffing shirt without moving shoulder: Caregiver independent with task;Patient able to independently direct caregiver Method for sponge bathing under operated UE: Caregiver independent with task Donning/doffing sling/immobilizer: Caregiver independent with task;Patient able to independently direct caregiver Correct positioning of sling/immobilizer: Caregiver independent with task;Patient able to independently direct caregiver (husband took photos with his phone to ensure sling position carryover) ROM for elbow, wrist and digits of operated UE: Caregiver independent with task;Patient able to independently direct caregiver Sling wearing schedule (on at all times/off for ADL's): Caregiver independent with task;Patient able to independently direct caregiver Proper positioning of operated UE when showering: Caregiver independent with task;Patient able to independently direct caregiver Positioning of UE while sleeping: Caregiver independent with task;Patient able to independently direct caregiver    Home Living Family/patient expects to be discharged to:: Private residence Living Arrangements: Spouse/significant other Available Help at Discharge: Available 24 hours/day Type of Home: House Home Access: Stairs to enter [employer] of Steps: 2 Entrance Stairs-Rails: None Home Layout: One level     Bathroom Shower/Tub: Chief Strategy Officer: Standard Bathroom Accessibility: No   Home Equipment: Agricultural consultant (2 wheels);Cane - single point Adaptive Equipment:  Reacher        Prior Functioning/Environment Prior Level of Function : Needs assist       Physical Assist : Mobility (physical) Mobility (physical): Gait;Stairs   Mobility Comments: slight unsteadiness noted, able to complete home  mobility at supervision assist ADLs Comments: showers and IADL support    OT Problem List: Impaired UE functional use;Pain;Impaired balance (sitting and/or standing)    AM-PAC OT 6 Clicks Daily Activity     Outcome Measure Help from another person eating meals?: A Little Help from another person taking care of personal grooming?: A Little Help from another person toileting, which includes using toliet, bedpan, or urinal?: A Little Help from another person bathing (including washing, rinsing, drying)?: A Little Help from another person to put on and taking off regular upper body clothing?: A Little Help from another person to put on and taking off regular lower body clothing?: A Little 6 Click Score: 18   End of Session Equipment Utilized During Treatment: Gait belt Nurse Communication: Mobility status;Precautions;Weight bearing status  Activity Tolerance: Patient tolerated treatment well Patient left: in chair;with chair alarm set;with call bell/phone within reach;with family/visitor present  OT Visit Diagnosis: Unsteadiness on feet (R26.81);Pain                Time: 1210-1300 OT Time Calculation (min): 50 min Charges:  OT General Charges $OT Visit: 1 Visit OT Evaluation $OT Eval Moderate Complexity: 1 Mod OT Treatments $Self Care/Home Management : 8-22 mins  Dave Mergen OT/L Acute Rehabilitation Department  [phone]  12/30/2023, 3:37 PM

## 2023-12-30 NOTE — Discharge Summary (Signed)
 Patient ID: Melanie Golden MRN: [id number] DOB/AGE: 71-Feb-1952 71 y.o.  Admit date: 12/29/2023 Discharge date: 12/30/2023  Admission Diagnoses:Left failed total shoulder, concern for rotator cuff tear vs infection   Discharge Diagnoses:  Principal Problem:   Status post reverse total replacement of left shoulder   Past Medical History:  Diagnosis Date   Arthritis    knee,shoulder,cervical,hand   Complication of anesthesia    Constipation    Fibromyalgia 1989   PONV (postoperative nausea and vomiting)      Procedures Performed: Left reverse revision  Total Shoulder Arthroplasty Left synovectomy and synovial biopsy  Discharged Condition: stable  Hospital Course: Patient brought in for scheduled procedure. She tolerated procedure well.  Was kept for monitoring overnight for pain control and medical monitoring postop and was found to be stable for DC home the morning after surgery.  Patient was instructed on specific activity restrictions and all questions were answered.   Opiates Today (MME): Today's  total administered Morphine  Milligram Equivalents: 0 Opiates Yesterday (MME): Yesterday's total administered Morphine  Milligram Equivalents: 30 Opiates Used in the last two days:  Inpatient Morphine  Milligram Equivalents Per Day 8/27 - 8/28   Values displayed are in units of MME/Day    Order Start / End Date Yesterday Today    oxyCODONE  (Oxy IR/ROXICODONE ) immediate release tablet 5 mg 8/27 - 8/27 0 of Unknown --    oxyCODONE  (ROXICODONE ) 5 MG/5ML solution 5 mg 8/27 - 8/27 0 of Unknown --      Group total: 0 of Unknown     fentaNYL  (SUBLIMAZE ) injection 25-50 mcg 8/27 - 8/27 0 of 45-90 --    fentaNYL  (SUBLIMAZE ) injection 50-100 mcg 8/27 - 8/27 15 of 15-30 --    fentaNYL  (SUBLIMAZE ) injection 8/27 - 8/27 *15 of 15 --    Daily Totals  * 30 of Unknown (at least 75-135) --  *One-Step medication  Calculation Errors     Order Type Date Details   oxyCODONE  (Oxy IR/ROXICODONE )  immediate release tablet 5 mg Ordered Dose -- Insufficient frequency information   oxyCODONE  (ROXICODONE ) 5 MG/5ML solution 5 mg Ordered Dose -- Insufficient frequency information            Consults: PT/OT  Significant Diagnostic Studies: No additional pertinent studies  Treatments: Surgery  Discharge Exam: General: resting in hospital bed, NAD Cardiac: regular rate Pulmonary: no increased work of breathing LUE: Dressing CDI and sling well fitting.  Axillary nerve sensation/motor altered in setting of block and unable to be fully tested.  Distal motor and sensory altered in setting of block.  Disposition: Discharge disposition: 01-Home or Self Care       Discharge Instructions     Call MD for:  redness, tenderness, or signs of infection (pain, swelling, redness, odor or green/yellow discharge around incision site)   Complete by: As directed    Call MD for:  severe uncontrolled pain   Complete by: As directed    Call MD for:  temperature >100.4   Complete by: As directed    Diet - low sodium heart healthy   Complete by: As directed       Allergies as of 12/30/2023       Reactions   Sudafed [pseudoephedrine Hcl]    Increased BP   Ampicillin Rash   Penicillins Rash   Sulfa Antibiotics Rash   Sulfamethoxazole-trimethoprim Rash        Medication List     TAKE these medications    acetaminophen  500 MG  tablet Commonly known as: TYLENOL  Take 2 tablets (1,000 mg total) by mouth every 8 (eight) hours for 14 days.   aspirin  81 MG chewable tablet Commonly known as: Aspirin  Childrens Chew 1 tablet (81 mg total) by mouth 2 (two) times daily. For 6 weeks for DVT prophylaxis after surgery   baclofen  10 MG tablet Commonly known as: LIORESAL  Take 1 tablet (10 mg total) by mouth 3 (three) times daily. As needed for muscle spasm What changed:  how much to take when to take this reasons to take this additional instructions   Biotin  5000 MCG Caps Take 5,000 mcg  by mouth every evening. Gummy   CAL-MAG PO Take 1 tablet by mouth every other day. In the morning.   cyanocobalamin  1000 MCG tablet Commonly known as: VITAMIN B12 Take 1,000 mcg by mouth in the morning.   docusate sodium  100 MG capsule Commonly known as: COLACE Take 100-200 mg by mouth See admin instructions. Takes two in the morning and one in the evening.   doxycycline  100 MG capsule Commonly known as: VIBRAMYCIN  Take 1 capsule (100 mg total) by mouth 2 (two) times daily for 14 days.   furosemide 20 MG tablet Commonly known as: LASIX Take 20 mg by mouth daily as needed (fluid retention.).   Gold Bond Psoriasis Relief 3 % Crea Generic drug: Salicylic Acid  Apply 1 application  topically daily.   HAIR SKIN AND NAILS FORMULA PO Take 1 tablet by mouth in the morning.   Ibsrela 50 MG Tabs Generic drug: Tenapanor HCl Take 50 mg by mouth in the morning and at bedtime.   lactulose  10 GM/15ML solution Commonly known as: CHRONULAC  Take 15 mLs (10 g total) by mouth 2 (two) times daily as needed for moderate constipation or severe constipation. What changed: when to take this   meloxicam  15 MG tablet Commonly known as: MOBIC  Take 1 tablet (15 mg total) by mouth daily. For 2 weeks for pain and inflammation. Then take as needed What changed:  when to take this reasons to take this additional instructions   multivitamin with minerals Tabs tablet Take 1 tablet by mouth in the morning.   nitrofurantoin  (macrocrystal-monohydrate) 100 MG capsule Commonly known as: MACROBID  Take 1 capsule (100 mg total) by mouth 2 (two) times daily.   OCUSOFT BABY EYELID & EYELASH EX Apply 1 Application topically 2 (two) times daily.   OMEGA-3 COMPLEX PO Take 1-2 capsules by mouth See admin instructions. OMEGAXL [facility] supplement Take 1 capsule by mouth in the morning & take 2 capsules by mouth in the evening.   omeprazole  20 MG capsule Commonly known as: PRILOSEC Take 1 capsule (20 mg  total) by mouth daily. To gastric protection while taking NSAIDs   ondansetron  4 MG tablet Commonly known as: Zofran  Take 1 tablet (4 mg total) by mouth every 8 (eight) hours as needed for up to 7 days for nausea or vomiting.   OVER THE COUNTER MEDICATION Take 1 capsule by mouth 2 (two) times a week. Viviscal for thinning hair.   oxyCODONE  5 MG immediate release tablet Commonly known as: Oxy IR/ROXICODONE  Take 1-2 pills every 6 hrs as needed for severe pain, no more than 6 per day   saccharomyces boulardii 250 MG capsule Commonly known as: FLORASTOR Take 250 mg by mouth 2 (two) times daily.   Saw Palmetto 450 MG Caps Take 450 mg by mouth in the morning and at bedtime.   SYSTANE NIGHTTIME OP Place 1 Application into both eyes  at bedtime.   Systane Ultra 0.4-0.3 % Soln Generic drug: Polyethyl Glycol-Propyl Glycol Place 1 drop into both eyes 3 (three) times daily as needed (dry/irritated eyes).   THERATEARS EYE NUTRITION PO Take 1 capsule by mouth in the morning and at bedtime.   triamcinolone  cream 0.1 % Commonly known as: KENALOG  Apply 1 Application topically 2 (two) times daily as needed (facial irritation.).   TURMERIC (CURCUMIN) PO Take 1 capsule by mouth at bedtime.   Vitamin D3 50 MCG (2000 UT) Tabs Take 2,000 Units by mouth every evening.        Aleck Stalling, PA-C 12/30/23

## 2023-12-30 NOTE — Plan of Care (Signed)
  Problem: Pain Management: Goal: Pain level will decrease with appropriate interventions Outcome: Progressing   Problem: Activity: Goal: Ability to tolerate increased activity will improve Outcome: Progressing   Problem: Education: Goal: Knowledge of the prescribed therapeutic regimen will improve Outcome: Progressing   Problem: Safety: Goal: Ability to remain free from injury will improve Outcome: Progressing   Problem: Coping: Goal: Level of anxiety will decrease Outcome: Progressing

## 2023-12-30 NOTE — Evaluation (Signed)
 Physical Therapy Evaluation Patient Details Name: Melanie Golden MRN: [id number] DOB: [birth year] Today's Date: 12/30/2023  History of Present Illness  Melanie Golden is a 73 yo female s/p Left revision to reverse total shoulder arthroplasty on 12/29/23. PMH: includes but no limited to hx of L reverse TSA, OA, constipation, fibromyalgia, PONV  Clinical Impression  Pt admitted with above diagnosis. Pt in recliner, agreeable to PT consult, husband at bedside. She is able to stand from recliner at supervision A and initiates steps with min A for amb. Cane provided in RUE for stability assist but places pt at increased risk for fall due to coordination deficits. She is able to take small steps x135ft, c/o dizziness. Recliner provided VSS, dizziness resolves. Able to negotiate step per home setup and should do well with a small amount of help from her husband. Likely 144ft is the upper limit of activity tolerance at this time. Pt returns to recliner and left in the care of OT. Pt currently with functional limitations due to the deficits listed below (see PT Problem List). Pt will benefit from acute skilled PT to increase their independence and safety with mobility to allow discharge.           If plan is discharge home, recommend the following: A little help with walking and/or transfers;A little help with bathing/dressing/bathroom;Assistance with cooking/housework;Help with stairs or ramp for entrance;Assist for transportation   Can travel by private vehicle        Equipment Recommendations None recommended by PT  Recommendations for Other Services       Functional Status Assessment Patient has had a recent decline in their functional status and demonstrates the ability to make significant improvements in function in a reasonable and predictable amount of time.     Precautions / Restrictions Precautions Precautions: Fall;Shoulder Type of Shoulder Precautions: Sling, NWB LUE Precaution/Restrictions  Comments: defer to OT consult Required Braces or Orthoses: Sling Restrictions Weight Bearing Restrictions Per Provider Order: Yes LUE Weight Bearing Per Provider Order: Non weight bearing      Mobility  Bed Mobility               General bed mobility comments: in recliner Patient Response: Anxious  Transfers Overall transfer level: Needs assistance Equipment used: None Transfers: Sit to/from Stand Sit to Stand: Supervision           General transfer comment: Pt able to come to stand on 2 attempts, anxious throughout session    Ambulation/Gait Ambulation/Gait assistance: Contact guard assist Gait Distance (Feet): 100 Feet Assistive device: None Gait Pattern/deviations: Step-through pattern, Decreased stride length, Wide base of support Gait velocity: dec     General Gait Details: Pt takes small steps in reciprocal pattern, does not pass stance limb. after 168ft reports increased dizziness and recliner provided, BP 120/80, O2 99%, HR 80bpm  Stairs Stairs: Yes Stairs assistance: Min assist Stair Management: No rails Number of Stairs: 1 General stair comments: Pt provided platform per home setup and is able to step up and down with min A to stabilize with gait belt and axillary support.  Wheelchair Mobility     Tilt Bed Tilt Bed Patient Response: Anxious  Modified Rankin (Stroke Patients Only)       Balance Overall balance assessment: Needs assistance Sitting-balance support: Feet supported Sitting balance-Leahy Scale: Good     Standing balance support: During functional activity Standing balance-Leahy Scale: Good  Pertinent Vitals/Pain Pain Assessment Pain Assessment: Faces Faces Pain Scale: Hurts little more Pain Location: abdominal discomfort related to constipation Pain Descriptors / Indicators: Discomfort, Aching Pain Intervention(s): Limited activity within patient's tolerance, Monitored during  session, Repositioned    Home Living Family/patient expects to be discharged to:: Private residence Living Arrangements: Spouse/significant other Available Help at Discharge: Available 24 hours/day Type of Home: House Home Access: Stairs to enter Entrance Stairs-Rails: None Entrance Stairs-Number of Steps: 2   Home Layout: One level Home Equipment: Agricultural consultant (2 wheels);Cane - single point      Prior Function Prior Level of Function : Needs assist       Physical Assist : Mobility (physical) Mobility (physical): Gait;Stairs   Mobility Comments: slight unsteadiness noted, able to complete home mobility at supervision assist       Extremity/Trunk Assessment   Upper Extremity Assessment Upper Extremity Assessment: Defer to OT evaluation    Lower Extremity Assessment Lower Extremity Assessment: Generalized weakness       Communication   Communication Communication: No apparent difficulties    Cognition Arousal: Alert Behavior During Therapy: WFL for tasks assessed/performed, Anxious   PT - Cognitive impairments: No apparent impairments                         Following commands: Intact       Cueing Cueing Techniques: Verbal cues, Gestural cues, Tactile cues     General Comments General comments (skin integrity, edema, etc.): anxious throughout, pt attributes dizziness to glasses, resolves with rest, VSS    Exercises     Assessment/Plan    PT Assessment Patient needs continued PT services  PT Problem List Decreased strength;Decreased balance;Decreased mobility;Decreased activity tolerance       PT Treatment Interventions DME instruction;Functional mobility training;Balance training;Patient/family education;Gait training;Therapeutic activities;Stair training;Therapeutic exercise    PT Goals (Current goals can be found in the Care Plan section)  Acute Rehab PT Goals Patient Stated Goal: return home PT Goal Formulation: With  patient/family Time For Goal Achievement: 01/13/24 Potential to Achieve Goals: Good    Frequency Min 2X/week     Co-evaluation               AM-PAC PT 6 Clicks Mobility  Outcome Measure Help needed turning from your back to your side while in a flat bed without using bedrails?: A Little Help needed moving from lying on your back to sitting on the side of a flat bed without using bedrails?: A Little Help needed moving to and from a bed to a chair (including a wheelchair)?: A Little Help needed standing up from a chair using your arms (e.g., wheelchair or bedside chair)?: A Little Help needed to walk in hospital room?: A Little Help needed climbing 3-5 steps with a railing? : A Little 6 Click Score: 18    End of Session Equipment Utilized During Treatment: Gait belt;Other (comment) (LUE sling) Activity Tolerance: Patient tolerated treatment well Patient left: in chair;with family/visitor present;Other (comment);with call bell/phone within reach (OT) Nurse Communication: Mobility status PT Visit Diagnosis: Muscle weakness (generalized) (M62.81);Unsteadiness on feet (R26.81);Difficulty in walking, not elsewhere classified (R26.2)    Time: 8859-8771 PT Time Calculation (min) (ACUTE ONLY): 48 min   Charges:   PT Evaluation $PT Eval Low Complexity: 1 Low PT Treatments $Gait Training: 23-37 mins PT General Charges $$ ACUTE PT VISIT: 1 Visit         Stann, PT Acute Rehabilitation Services Office: [phone]  12/30/2023   Stann DELENA Ohara 12/30/2023, 12:54 PM

## 2023-12-30 NOTE — Discharge Instructions (Signed)
 Bonner Hair MD, MPH Aleck Stalling, PA-C [facility] [address] [phone] (tel)   [phone] (fax)   POST-OPERATIVE INSTRUCTIONS - TOTAL SHOULDER REPLACEMENT    WOUND CARE You may leave the operative dressing in place until your follow-up appointment. KEEP THE INCISIONS CLEAN AND DRY. There may be a small amount of fluid/bleeding leaking at the surgical site. This is normal after surgery.  If it fills with liquid or blood please call us  immediately to change it for you. Use the provided ice machine or Ice packs as often as possible for the first 3-4 days, then as needed for pain relief.   Keep a layer of cloth or a shirt between your skin and the cooling unit to prevent frost bite as it can get very cold.  SHOWERING: - You may shower on Post-Op Day #2.  - The dressing is water  resistant but do not scrub it as it may start to peel up.   - You may remove the sling for showering - Gently pat the area dry.  - Do not soak the shoulder in water .  - Do not go swimming in the pool or ocean until your incision has completely healed (about 4-6 weeks after surgery) - KEEP THE INCISIONS CLEAN AND DRY.  EXERCISES Wear the sling at all times  You may remove the sling for showering, but keep the arm across the chest or in a secondary sling.    Accidental/Purposeful External Rotation and shoulder flexion (reaching behind you) is to be avoided at all costs for the first month. It is ok to come out of your sling if your are sitting and have assistance for eating.   Do not lift anything heavier than 1 pound until we discuss it further in clinic.  It is normal for your fingers/hand to become more swollen after surgery and discolored from bruising.   This will resolve over the first few weeks usually after surgery. Please continue to ambulate and do not stay sitting or lying for too long.  Perform foot and wrist pumps to assist in circulation.  PHYSICAL  THERAPY - No therapy for 4 weeks  REGIONAL ANESTHESIA (NERVE BLOCKS) The anesthesia team may have performed a nerve block for you this is a great tool used to minimize pain.   The block may start wearing off overnight (between 8-24 hours postop) When the block wears off, your pain may go from nearly zero to the pain you would have had postop without the block. This is an abrupt transition but nothing dangerous is happening.   This can be a challenging period but utilize your as needed pain medications to try and manage this period. We suggest you use the pain medication the first night prior to going to bed, to ease this transition.  You may take an extra dose of narcotic when this happens if needed   POST-OP MEDICATIONS- Multimodal approach to pain control In general your pain will be controlled with a combination of substances.  Prescriptions unless otherwise discussed are electronically sent to your pharmacy.  This is a carefully made plan we use to minimize narcotic use.     Meloxicam  - Anti-inflammatory medication taken on a scheduled basis Acetaminophen  - Non-narcotic pain medicine taken on a scheduled basis  Oxycodone  - This is a strong narcotic, to be used only on an "as needed" basis for SEVERE pain. Aspirin  81mg  - This medicine is used to minimize the risk of blood clots after surgery.  Omeprazole  - daily medicine to protect your stomach while taking anti-inflammatories.   Zofran  -  take as needed for nausea Doxycycline  - this is an antibiotic, take as instructed for 2 weeks  FOLLOW-UP If you develop a Fever (>101.5), Redness or Drainage from the surgical incision site, please call our office to arrange for an evaluation. Please call the office to schedule a follow-up appointment for a wound check, 7-10 days post-operatively.  IF YOU HAVE ANY QUESTIONS, PLEASE FEEL FREE TO CALL OUR OFFICE.  HELPFUL INFORMATION  Your arm will be in a sling following surgery. You will be in  this sling for the next 4 weeks.   You may be more comfortable sleeping in a semi-seated position the first few nights following surgery.  Keep a pillow propped under the elbow and forearm for comfort.  If you have a recliner type of chair it might be beneficial.  If not that is fine too, but it would be helpful to sleep propped up with pillows behind your operated shoulder as well under your elbow and forearm.  This will reduce pulling on the suture lines.  When dressing, put your operative arm in the sleeve first.  When getting undressed, take your operative arm out last.  Loose fitting, button-down shirts are recommended.  In most states it is against the law to drive while your arm is in a sling. And certainly against the law to drive while taking narcotics.  You may return to work/school in the next couple of days when you feel up to it. Desk work and typing in the sling is fine.  We suggest you use the pain medication the first night prior to going to bed, in order to ease any pain when the anesthesia wears off. You should avoid taking pain medications on an empty stomach as it will make you nauseous.  You should wean off your narcotic medicines as soon as you are able.     Most patients will be off narcotics before their first postop appointment.   Do not drink alcoholic beverages or take illicit drugs when taking pain medications.  Pain medication may make you constipated.  Below are a few solutions to try in this order: Decrease the amount of pain medication if you aren't having pain. Drink lots of decaffeinated fluids. Drink prune juice and/or each dried prunes  If the first 3 don't work start with additional solutions Take Colace - an over-the-counter stool softener Take Senokot - an over-the-counter laxative Take Miralax  - a stronger over-the-counter laxative   Dental Antibiotics:  We require dental prophylaxis for 2 years after a shoulder replacement  Contact your surgeon  for an antibiotic prescription, prior to your dental procedure.   For more information including helpful videos and documents visit our website:   [email or web address]

## 2023-12-30 NOTE — Progress Notes (Signed)
   12/30/23 1039  TOC Brief Assessment  Insurance and Status Reviewed  Patient has primary care physician Yes  Home environment has been reviewed home with spouse  Prior level of function: independent  Prior/Current Home Services No current home services  Social Drivers of Health Review SDOH reviewed no interventions necessary  Readmission risk has been reviewed Yes  Transition of care needs no transition of care needs at this time

## 2024-01-07 DIAGNOSIS — T849XXD Unspecified complication of internal orthopedic prosthetic device, implant and graft, subsequent encounter: Secondary | ICD-10-CM | POA: Diagnosis not present

## 2024-01-19 LAB — AEROBIC/ANAEROBIC CULTURE W GRAM STAIN (SURGICAL/DEEP WOUND)
Culture: NO GROWTH
Culture: NO GROWTH
Gram Stain: NONE SEEN
Gram Stain: NONE SEEN

## 2024-01-23 LAB — AEROBIC/ANAEROBIC CULTURE W GRAM STAIN (SURGICAL/DEEP WOUND)
Culture: NO GROWTH
Gram Stain: NONE SEEN

## 2024-01-27 DIAGNOSIS — T849XXD Unspecified complication of internal orthopedic prosthetic device, implant and graft, subsequent encounter: Secondary | ICD-10-CM | POA: Diagnosis not present

## 2024-02-03 DIAGNOSIS — M6281 Muscle weakness (generalized): Secondary | ICD-10-CM | POA: Diagnosis not present

## 2024-02-03 DIAGNOSIS — M25612 Stiffness of left shoulder, not elsewhere classified: Secondary | ICD-10-CM | POA: Diagnosis not present

## 2024-02-03 DIAGNOSIS — M19012 Primary osteoarthritis, left shoulder: Secondary | ICD-10-CM | POA: Diagnosis not present

## 2024-02-09 ENCOUNTER — Encounter: Payer: Self-pay | Admitting: Family Medicine

## 2024-02-09 DIAGNOSIS — Z1231 Encounter for screening mammogram for malignant neoplasm of breast: Secondary | ICD-10-CM

## 2024-02-16 DIAGNOSIS — M19012 Primary osteoarthritis, left shoulder: Secondary | ICD-10-CM | POA: Diagnosis not present

## 2024-02-16 DIAGNOSIS — M25612 Stiffness of left shoulder, not elsewhere classified: Secondary | ICD-10-CM | POA: Diagnosis not present

## 2024-02-16 DIAGNOSIS — M6281 Muscle weakness (generalized): Secondary | ICD-10-CM | POA: Diagnosis not present

## 2024-02-18 DIAGNOSIS — M6281 Muscle weakness (generalized): Secondary | ICD-10-CM | POA: Diagnosis not present

## 2024-02-18 DIAGNOSIS — M19012 Primary osteoarthritis, left shoulder: Secondary | ICD-10-CM | POA: Diagnosis not present

## 2024-02-18 DIAGNOSIS — M25612 Stiffness of left shoulder, not elsewhere classified: Secondary | ICD-10-CM | POA: Diagnosis not present

## 2024-02-22 DIAGNOSIS — M25612 Stiffness of left shoulder, not elsewhere classified: Secondary | ICD-10-CM | POA: Diagnosis not present

## 2024-02-22 DIAGNOSIS — M19012 Primary osteoarthritis, left shoulder: Secondary | ICD-10-CM | POA: Diagnosis not present

## 2024-02-22 DIAGNOSIS — M6281 Muscle weakness (generalized): Secondary | ICD-10-CM | POA: Diagnosis not present

## 2024-02-24 DIAGNOSIS — M19012 Primary osteoarthritis, left shoulder: Secondary | ICD-10-CM | POA: Diagnosis not present

## 2024-02-24 DIAGNOSIS — M25612 Stiffness of left shoulder, not elsewhere classified: Secondary | ICD-10-CM | POA: Diagnosis not present

## 2024-02-24 DIAGNOSIS — M6281 Muscle weakness (generalized): Secondary | ICD-10-CM | POA: Diagnosis not present

## 2024-02-28 DIAGNOSIS — M6281 Muscle weakness (generalized): Secondary | ICD-10-CM | POA: Diagnosis not present

## 2024-02-28 DIAGNOSIS — M19012 Primary osteoarthritis, left shoulder: Secondary | ICD-10-CM | POA: Diagnosis not present

## 2024-02-28 DIAGNOSIS — M25612 Stiffness of left shoulder, not elsewhere classified: Secondary | ICD-10-CM | POA: Diagnosis not present

## 2024-03-02 DIAGNOSIS — M19012 Primary osteoarthritis, left shoulder: Secondary | ICD-10-CM | POA: Diagnosis not present

## 2024-03-02 DIAGNOSIS — M6281 Muscle weakness (generalized): Secondary | ICD-10-CM | POA: Diagnosis not present

## 2024-03-02 DIAGNOSIS — M25612 Stiffness of left shoulder, not elsewhere classified: Secondary | ICD-10-CM | POA: Diagnosis not present

## 2024-03-06 DIAGNOSIS — M6281 Muscle weakness (generalized): Secondary | ICD-10-CM | POA: Diagnosis not present

## 2024-03-06 DIAGNOSIS — M19012 Primary osteoarthritis, left shoulder: Secondary | ICD-10-CM | POA: Diagnosis not present

## 2024-03-06 DIAGNOSIS — M25612 Stiffness of left shoulder, not elsewhere classified: Secondary | ICD-10-CM | POA: Diagnosis not present

## 2024-03-09 DIAGNOSIS — M19012 Primary osteoarthritis, left shoulder: Secondary | ICD-10-CM | POA: Diagnosis not present

## 2024-03-09 DIAGNOSIS — M25612 Stiffness of left shoulder, not elsewhere classified: Secondary | ICD-10-CM | POA: Diagnosis not present

## 2024-03-09 DIAGNOSIS — M6281 Muscle weakness (generalized): Secondary | ICD-10-CM | POA: Diagnosis not present

## 2024-03-15 DIAGNOSIS — M19012 Primary osteoarthritis, left shoulder: Secondary | ICD-10-CM | POA: Diagnosis not present

## 2024-03-15 DIAGNOSIS — M6281 Muscle weakness (generalized): Secondary | ICD-10-CM | POA: Diagnosis not present

## 2024-03-15 DIAGNOSIS — M25612 Stiffness of left shoulder, not elsewhere classified: Secondary | ICD-10-CM | POA: Diagnosis not present

## 2024-03-22 DIAGNOSIS — M19012 Primary osteoarthritis, left shoulder: Secondary | ICD-10-CM | POA: Diagnosis not present

## 2024-03-22 DIAGNOSIS — M25612 Stiffness of left shoulder, not elsewhere classified: Secondary | ICD-10-CM | POA: Diagnosis not present

## 2024-03-22 DIAGNOSIS — M6281 Muscle weakness (generalized): Secondary | ICD-10-CM | POA: Diagnosis not present

## 2024-03-28 DIAGNOSIS — M25612 Stiffness of left shoulder, not elsewhere classified: Secondary | ICD-10-CM | POA: Diagnosis not present

## 2024-03-28 DIAGNOSIS — M19012 Primary osteoarthritis, left shoulder: Secondary | ICD-10-CM | POA: Diagnosis not present

## 2024-03-28 DIAGNOSIS — M6281 Muscle weakness (generalized): Secondary | ICD-10-CM | POA: Diagnosis not present

## 2024-04-04 DIAGNOSIS — Z4789 Encounter for other orthopedic aftercare: Secondary | ICD-10-CM | POA: Diagnosis not present

## 2024-04-04 DIAGNOSIS — Z96612 Presence of left artificial shoulder joint: Secondary | ICD-10-CM | POA: Diagnosis not present
